# Patient Record
Sex: Female | Born: 1960 | Race: White | Hispanic: No | Marital: Single | Smoking: Never smoker
Health system: Southern US, Community
[De-identification: ages and names within clinical notes are randomized; demographics above are authoritative.]

## PROBLEM LIST (undated history)

## (undated) DIAGNOSIS — D649 Anemia, unspecified: Secondary | ICD-10-CM

## (undated) DIAGNOSIS — Z5189 Encounter for other specified aftercare: Secondary | ICD-10-CM

## (undated) DIAGNOSIS — K589 Irritable bowel syndrome without diarrhea: Secondary | ICD-10-CM

## (undated) DIAGNOSIS — I7782 Antineutrophilic cytoplasmic antibody (ANCA) vasculitis: Secondary | ICD-10-CM

## (undated) DIAGNOSIS — J189 Pneumonia, unspecified organism: Secondary | ICD-10-CM

## (undated) DIAGNOSIS — I776 Arteritis, unspecified: Secondary | ICD-10-CM

## (undated) HISTORY — DX: Irritable bowel syndrome, unspecified: K58.9

## (undated) HISTORY — PX: BREAST EXCISIONAL BIOPSY: SUR124

---

## 1999-11-23 ENCOUNTER — Other Ambulatory Visit: Admission: RE | Admit: 1999-11-23 | Discharge: 1999-11-23 | Payer: Self-pay | Admitting: Family Medicine

## 2000-02-26 ENCOUNTER — Encounter: Admission: RE | Admit: 2000-02-26 | Discharge: 2000-02-26 | Payer: Self-pay | Admitting: *Deleted

## 2000-02-26 ENCOUNTER — Encounter: Payer: Self-pay | Admitting: Family Medicine

## 2000-03-14 ENCOUNTER — Ambulatory Visit (HOSPITAL_COMMUNITY): Admission: RE | Admit: 2000-03-14 | Discharge: 2000-03-14 | Payer: Self-pay | Admitting: Gastroenterology

## 2000-03-14 ENCOUNTER — Encounter (INDEPENDENT_AMBULATORY_CARE_PROVIDER_SITE_OTHER): Payer: Self-pay | Admitting: Specialist

## 2001-06-19 ENCOUNTER — Encounter: Admission: RE | Admit: 2001-06-19 | Discharge: 2001-06-19 | Payer: Self-pay | Admitting: Family Medicine

## 2001-06-19 ENCOUNTER — Encounter: Payer: Self-pay | Admitting: Family Medicine

## 2004-07-21 ENCOUNTER — Encounter: Admission: RE | Admit: 2004-07-21 | Discharge: 2004-07-21 | Payer: Self-pay | Admitting: Family Medicine

## 2004-11-13 ENCOUNTER — Other Ambulatory Visit: Admission: RE | Admit: 2004-11-13 | Discharge: 2004-11-13 | Payer: Self-pay | Admitting: Family Medicine

## 2006-05-06 ENCOUNTER — Other Ambulatory Visit: Admission: RE | Admit: 2006-05-06 | Discharge: 2006-05-06 | Payer: Self-pay | Admitting: Family Medicine

## 2006-07-14 ENCOUNTER — Encounter: Admission: RE | Admit: 2006-07-14 | Discharge: 2006-07-14 | Payer: Self-pay | Admitting: Family Medicine

## 2007-05-17 ENCOUNTER — Other Ambulatory Visit: Admission: RE | Admit: 2007-05-17 | Discharge: 2007-05-17 | Payer: Self-pay | Admitting: Family Medicine

## 2007-12-18 ENCOUNTER — Encounter (HOSPITAL_COMMUNITY): Admission: RE | Admit: 2007-12-18 | Discharge: 2008-03-17 | Payer: Self-pay | Admitting: Gastroenterology

## 2008-07-08 ENCOUNTER — Encounter: Admission: RE | Admit: 2008-07-08 | Discharge: 2008-07-08 | Payer: Self-pay | Admitting: Family Medicine

## 2008-07-19 ENCOUNTER — Encounter: Admission: RE | Admit: 2008-07-19 | Discharge: 2008-07-19 | Payer: Self-pay | Admitting: Family Medicine

## 2008-08-15 ENCOUNTER — Other Ambulatory Visit: Admission: RE | Admit: 2008-08-15 | Discharge: 2008-08-15 | Payer: Self-pay | Admitting: Family Medicine

## 2009-01-31 ENCOUNTER — Ambulatory Visit (HOSPITAL_BASED_OUTPATIENT_CLINIC_OR_DEPARTMENT_OTHER): Admission: RE | Admit: 2009-01-31 | Discharge: 2009-01-31 | Payer: Self-pay | Admitting: Orthopedic Surgery

## 2009-01-31 HISTORY — PX: DORSAL COMPARTMENT RELEASE: SHX1474

## 2009-01-31 HISTORY — PX: GANGLION CYST EXCISION: SHX1691

## 2010-02-06 ENCOUNTER — Encounter: Admission: RE | Admit: 2010-02-06 | Discharge: 2010-02-06 | Payer: Self-pay | Admitting: Family Medicine

## 2010-02-06 ENCOUNTER — Other Ambulatory Visit: Admission: RE | Admit: 2010-02-06 | Discharge: 2010-02-06 | Payer: Self-pay | Admitting: Family Medicine

## 2011-05-04 NOTE — Op Note (Signed)
[name], [name]                ACCOUNT NO.:  [id number]   MEDICAL RECORD NO.:  [id number]          PATIENT TYPE:  AMB   LOCATION:  DSC                          FACILITY:  [facility]   PHYSICIAN:  Katy Fitch. Sypher, M.D. 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   OPERATIONS:  1. Excision of volar capsular ganglion left wrist presenting between      the superficial and dorsal branch of the radial artery.  2. Release of first dorsal compartment, left wrist.   OPERATING SURGEON:  Katy Fitch. Sypher, MD   ASSISTANT:  Marveen Reeks Dasnoit, PA-C   ANESTHESIA:  General by LMA.   SUPERVISING ANESTHESIOLOGIST:  Janetta Hora. 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  Clinical examination revealed marked  swelling of the first dorsal compartment.  Her Finkelstein maneuver is  markedly painful.  She also had a mass presenting between the  superficial and dorsal branches of the radial artery.  This consistent  with a volar ganglion.   She requested correction of both predicaments.   After informed consent, she was brought to the operating room at this  time.  Questions regarding the surgery were invited and answered in  detail preoperatively.   PROCEDURE:  Lima D.  Deakins was brought to the operating room and  placed in supine position on the operating table.   Following the induction of general anesthesia by LMA technique, the left  arm was prepped  with Betadine soap solution and sterilely draped.  A  pneumatic tourniquet was applied to the proximal right brachium.   Following exsanguination of the left arm with an Esmarch bandage, the  arterial tourniquet was inflated to 230 mmHg.  Procedure commenced with  a single transverse incision extending from the first dorsal compartment  across the bifurcation of the radial artery.  Very careful dissection  identified the most palmar branch of the superficial radial sensory  branches.  This gently retracted with a blunt Ragnell retractor.  The  first dorsal compartment was isolated, cleared of all soft tissue, and  split with scalpel and scissors.  There was a conjoined abductor  pollicis longus tendon of large-caliber within the sheath and an  extensor pollicis brevis tendon.  No septum was identified.  Complete  release was ensured.  More palmar dissection revealed through a separate  fascial plane the presenting ganglion.  This was at the bifurcation in  the notch of the dorsal and superficial radial artery branches.   This was circumferentially dissected, decompressed to its contents, and  followed to the wrist capsule.  A small  rongeur was used to remove the  membrane as well as the sinus tract.  The exit point from the volar  wrist ligaments was electrocauterized with bipolar forceps.   The wound was then repaired with intradermal 3-0 Prolene suture.  A  compressive dressing was applied with of a volar plaster splint  maintaining the wrist in 5 degrees dorsiflexion.  For aftercare Ms.  Muha was provided with prescription for Vicodin 5 mg one p.o. q.4-6 h.  p.r.n. pain, 20 tablets without refill.   I will see her back for follow up in office in 1 week or sooner if any  problems.      Katy Fitch Sypher, M.D.  Electronically Signed     RVS/MEDQ  D:  01/31/2009  T:  01/31/2009  Job:  [id number]

## 2011-07-28 ENCOUNTER — Other Ambulatory Visit (HOSPITAL_COMMUNITY)
Admission: RE | Admit: 2011-07-28 | Discharge: 2011-07-28 | Disposition: A | Discharge status: Home / Self Care | Payer: BC Managed Care – PPO | Source: Ambulatory Visit | Attending: Family Medicine | Admitting: Family Medicine

## 2011-07-28 ENCOUNTER — Other Ambulatory Visit: Payer: Self-pay | Admitting: Physician Assistant

## 2011-07-28 DIAGNOSIS — Z124 Encounter for screening for malignant neoplasm of cervix: Secondary | ICD-10-CM | POA: Insufficient documentation

## 2011-07-29 ENCOUNTER — Ambulatory Visit (INDEPENDENT_AMBULATORY_CARE_PROVIDER_SITE_OTHER): Payer: Self-pay | Admitting: General Surgery

## 2011-08-03 ENCOUNTER — Encounter (INDEPENDENT_AMBULATORY_CARE_PROVIDER_SITE_OTHER): Payer: Self-pay | Admitting: General Surgery

## 2011-08-03 ENCOUNTER — Ambulatory Visit (INDEPENDENT_AMBULATORY_CARE_PROVIDER_SITE_OTHER): Payer: BC Managed Care – PPO | Admitting: General Surgery

## 2011-08-03 VITALS — BP 98/68 | HR 66 | Temp 97.8°F | Ht 65.0 in | Wt 110.8 lb

## 2011-08-03 DIAGNOSIS — D171 Benign lipomatous neoplasm of skin and subcutaneous tissue of trunk: Secondary | ICD-10-CM

## 2011-08-03 DIAGNOSIS — D1779 Benign lipomatous neoplasm of other sites: Secondary | ICD-10-CM

## 2011-08-03 NOTE — Progress Notes (Signed)
Yesenia Crawford is a 50 y.o. female.    Chief Complaint  Patient presents with  . Lipoma    Chest Wall Lipoma 3cm    HPI HPI This patient is referred by Norm Parcel for evaluation of a mass on her chest wall which she first noticed a few years ago. She did not concern herself with the mass but she states that it has had some slight increase in size and is concerning to her. She denies any pain from that area she also denies any redness or drainage. She denies any other breast lumps and states that she does do self breast exam. She is due for a mammogram. She denies any first degree relatives with a history of breast cancer.                  Past Medical History  Diagnosis Date  . Ulcerative colitis 1981  . IBS (irritable bowel syndrome)     Past Surgical History  Procedure Date  . Breast cyst excision     History reviewed. No pertinent family history.  Social History History  Substance Use Topics  . Smoking status: Never Smoker   . Smokeless tobacco: Not on file  . Alcohol Use: 1.2 oz/week    2 Glasses of wine per week    Allergies no known allergies  Current Outpatient Prescriptions  Medication Sig Dispense Refill  . azaTHIOprine (IMURAN) 50 MG tablet       . folic acid (FOLVITE) 1 MG tablet       . sulfaSALAzine (AZULFIDINE) 500 MG tablet         Review of Systems Review of Systems  Unable to perform ROS Constitutional: Negative.   HENT: Negative.   Eyes: Negative.   Respiratory: Negative.   Cardiovascular: Negative.   Gastrointestinal: Positive for abdominal pain and diarrhea.  Genitourinary: Negative.   Musculoskeletal: Negative.   Skin: Negative.   Neurological: Negative.   Endo/Heme/Allergies: Negative.   Psychiatric/Behavioral: Negative.     Physical Exam Physical Exam  Constitutional: She is oriented to person, place, and time. She appears well-developed and well-nourished. No distress.  HENT:  Head: Normocephalic and atraumatic.  Eyes:  Conjunctivae and EOM are normal. Pupils are equal, round, and reactive to light. Right eye exhibits no discharge. Left eye exhibits no discharge. No scleral icterus.  Neck: Normal range of motion. Neck supple. No tracheal deviation present.  Cardiovascular: Normal rate, regular rhythm and normal heart sounds.   Respiratory: Effort normal and breath sounds normal. No stridor. No respiratory distress. She has no wheezes.       3 cm x 3 cm, mobile, soft, lesion at the lateral border of the right pectoralis muscle, no skin changes, likely consistent with lipoma  GI: Soft. Bowel sounds are normal. She exhibits no distension and no mass. There is no tenderness. There is no rebound and no guarding.  Musculoskeletal: Normal range of motion. She exhibits no tenderness.  Neurological: She is alert and oriented to person, place, and time.  Skin: Skin is warm and dry. No rash noted. She is not diaphoretic. No erythema. No pallor.     Blood pressure 98/68, pulse 66, temperature 97.8 F (36.6 C), temperature source Temporal, height 5\' 5"  (1.651 m), weight 110 lb 12.8 oz (50.259 kg).  Assessment/Plan Chest wall lipoma  I think that this mass is most likely a soft tissue lipoma and is not very concerning for malignancy. I discussed with the patient the options for continued  observation versus excisional biopsy and she would like to have the lesion removed. I discussed with her the risks of the procedure including infection, bleeding, pain, scarring, poor cosmesis, and injury, and recurrence and she expressed understanding and desires to proceed with excision of right chest mass.  Lodema Pilot DAVID 08/03/2011, 6:29 PM

## 2011-09-20 ENCOUNTER — Other Ambulatory Visit: Payer: Self-pay | Admitting: Gastroenterology

## 2011-11-02 ENCOUNTER — Other Ambulatory Visit: Payer: Self-pay | Admitting: Physician Assistant

## 2011-11-02 DIAGNOSIS — Z1231 Encounter for screening mammogram for malignant neoplasm of breast: Secondary | ICD-10-CM

## 2011-11-03 ENCOUNTER — Telehealth (INDEPENDENT_AMBULATORY_CARE_PROVIDER_SITE_OTHER): Payer: Self-pay | Admitting: General Surgery

## 2011-11-08 ENCOUNTER — Other Ambulatory Visit (INDEPENDENT_AMBULATORY_CARE_PROVIDER_SITE_OTHER): Payer: Self-pay

## 2011-11-09 NOTE — Telephone Encounter (Signed)
Can we schedule surgery or would like prefer to see patient back in office prior to scheduling Breast Lipoma Excision.

## 2011-11-24 ENCOUNTER — Ambulatory Visit: Payer: BC Managed Care – PPO

## 2011-11-25 ENCOUNTER — Ambulatory Visit
Admission: RE | Admit: 2011-11-25 | Discharge: 2011-11-25 | Disposition: A | Discharge status: Home / Self Care | Payer: BC Managed Care – PPO | Source: Ambulatory Visit | Attending: Physician Assistant | Admitting: Physician Assistant

## 2011-11-25 DIAGNOSIS — Z1231 Encounter for screening mammogram for malignant neoplasm of breast: Secondary | ICD-10-CM

## 2012-01-05 ENCOUNTER — Other Ambulatory Visit (INDEPENDENT_AMBULATORY_CARE_PROVIDER_SITE_OTHER): Payer: Self-pay | Admitting: General Surgery

## 2012-02-25 ENCOUNTER — Encounter (HOSPITAL_BASED_OUTPATIENT_CLINIC_OR_DEPARTMENT_OTHER): Payer: Self-pay | Admitting: *Deleted

## 2012-02-29 ENCOUNTER — Encounter (HOSPITAL_BASED_OUTPATIENT_CLINIC_OR_DEPARTMENT_OTHER): Payer: Self-pay | Admitting: *Deleted

## 2012-02-29 NOTE — Pre-Procedure Instructions (Signed)
PT STATES SHE HAS HAD NON BLOODY DIARRHEA X3 WKS WITH WT LOSS 12 LBS. NOW WITH CONG AND DRY COUGH LOW GRADE FEVERS X4D. SHE WILL CALL us IF FEVER GOES UP AND SYMPTOMS WORSEN

## 2012-03-01 ENCOUNTER — Ambulatory Visit (INDEPENDENT_AMBULATORY_CARE_PROVIDER_SITE_OTHER): Payer: BC Managed Care – PPO | Admitting: Family Medicine

## 2012-03-01 ENCOUNTER — Ambulatory Visit: Payer: BC Managed Care – PPO

## 2012-03-01 VITALS — BP 103/68 | HR 103 | Temp 99.7°F | Resp 18 | Ht 65.0 in | Wt 103.0 lb

## 2012-03-01 DIAGNOSIS — R197 Diarrhea, unspecified: Secondary | ICD-10-CM

## 2012-03-01 DIAGNOSIS — R509 Fever, unspecified: Secondary | ICD-10-CM

## 2012-03-01 DIAGNOSIS — R1084 Generalized abdominal pain: Secondary | ICD-10-CM

## 2012-03-01 DIAGNOSIS — R0989 Other specified symptoms and signs involving the circulatory and respiratory systems: Secondary | ICD-10-CM

## 2012-03-01 DIAGNOSIS — H532 Diplopia: Secondary | ICD-10-CM

## 2012-03-01 LAB — POCT CBC
Granulocyte percent: 76.7 % (ref 37–80)
HCT, POC: 29.3 % — AB (ref 37.7–47.9)
Hemoglobin: 9.1 g/dL — AB (ref 12.2–16.2)
Lymph, poc: 1.2 (ref 0.6–3.4)
MCH, POC: 29.4 pg (ref 27–31.2)
MCHC: 31.1 g/dL — AB (ref 31.8–35.4)
MCV: 94.4 fL (ref 80–97)
MID (cbc): 0.4 (ref 0–0.9)
MPV: 8.2 fL (ref 0–99.8)
POC Granulocyte: 5.3 (ref 2–6.9)
POC LYMPH PERCENT: 17.8 %L (ref 10–50)
POC MID %: 5.5 %M (ref 0–12)
Platelet Count, POC: 393 10*3/uL (ref 142–424)
RBC: 3.1 M/uL — AB (ref 4.04–5.48)
RDW, POC: 13.8 %
WBC: 6.9 10*3/uL (ref 4.6–10.2)

## 2012-03-01 MED ORDER — AZITHROMYCIN 250 MG PO TABS: ORAL_TABLET | ORAL | Status: AC

## 2012-03-01 NOTE — Progress Notes (Signed)
[facility]:  Office Visit  Chief Complaint:  Chief Complaint  Patient presents with  . Diarrhea    * 3 weeks (has seen 2 different Dr's for this)  . Fever    low grade   . Chest Pain    when takes a deep breath- c/o of congestion also    HPI: Yesenia Crawford is a 51 y.o. female who complains of  A 3 week h/o nonbloody diarrhea which she was given cipro and flagyl for possible GI issues sicne she has ulcerative colitis, her stool cx came back for negative growth; she finished the medication but continues to ohave diarrhea. She denies this is similar to her UC. She has associated low grade temp and also chest congestion during this whole time and feels fatigued. She does not have an appetite however is able to eat. Diarrhea has no odor, no history of C. Diff  Dr. Laural Benes is her GI specialist.   Past Medical History  Diagnosis Date  . Ulcerative colitis 1981    HAS CURRENT NONBLOODY DIARRHEA X3 WKS  . IBS (irritable bowel syndrome)   . Ulcerative colitis    Past Surgical History  Procedure Date  . Breast cyst excision   . Ganglion cyst excision 01/31/2009    left wrist  . Dorsal compartment release 01/31/2009    release 1st dorsal compartment   History   Social History  . Marital Status: Single    Spouse Name: N/A    Number of Children: N/A  . Years of Education: N/A   Social History Main Topics  . Smoking status: Never Smoker   . Smokeless tobacco: None  . Alcohol Use: 1.2 oz/week    2 Glasses of wine per week     SOCIAL  . Drug Use: No  . Sexually Active: None   Other Topics Concern  . None   Social History Narrative  . None   Family History  Problem Relation Age of Onset  . Cancer Mother    Allergies  Allergen Reactions  . Metronidazole Hives   Prior to Admission medications   Medication Sig Start Date End Date Taking? Authorizing Provider  azaTHIOprine (IMURAN) 50 MG tablet 100 mg daily.  06/21/11  Yes Historical Provider, MD    folic acid (FOLVITE) 1 MG tablet 1 mg daily.  05/21/11  Yes Historical Provider, MD  sulfaSALAzine (AZULFIDINE) 500 MG tablet 500 mg 4 (four) times daily.  07/09/11  Yes Historical Provider, MD  Multiple Vitamin (MULITIVITAMIN WITH MINERALS) TABS Take 1 tablet by mouth daily.    Historical Provider, MD     ROS: The patient denies chills, night sweats, unintentional weight loss, chest pain, palpitations, wheezing, dyspnea on exertion, nausea, vomiting, abdominal pain, dysuria, hematuria, melena, numbness, weakness, or tingling.+ diarrhea. Chest congestion, fevers All other systems have been reviewed and were otherwise negative with the exception of those mentioned in the HPI and as above.    PHYSICAL EXAM: Filed Vitals:   03/01/12 1929  BP: 103/68  Pulse: 103  Temp: 99.7 F (37.6 C)  Resp: 18   Filed Vitals:   03/01/12 1929  Height: 5\' 5"  (1.651 m)  Weight: 103 lb (46.72 kg)   Body mass index is 17.14 kg/(m^2).  General: Alert, no acute distress, tired appearing HEENT:  Normocephalic, atraumatic, oropharynx patent. TM nl, no sinus tenderness, no exudates. EOMI, PERRLA, mucosa dry Cardiovascular:  Regular rate and rhythm, no rubs murmurs or gallops.  No Carotid bruits,  radial pulse intact. No pedal edema.  Respiratory: Clear to auscultation bilaterally.  No wheezes, rales, or rhonchi.  No cyanosis, no use of accessory musculature GI: No organomegaly, abdomen is soft and non-tender, positive bowel sounds.  No masses. Skin: No rashes. Neurologic: Facial musculature symmetric. Psychiatric: Patient is appropriate throughout our interaction. Lymphatic: No cervical lymphadenopathy Musculoskeletal: Gait intact.   LABS: Results for orders placed in visit on 03/01/12  POCT CBC      Component Value Range   WBC 6.9  4.6 - 10.2 (K/uL)   Lymph, poc 1.2  0.6 - 3.4    POC LYMPH PERCENT 17.8  10 - 50 (%L)   MID (cbc) 0.4  0 - 0.9    POC MID % 5.5  0 - 12 (%M)   POC Granulocyte 5.3  2 - 6.9     Granulocyte percent 76.7  37 - 80 (%G)   RBC 3.10 (*) 4.04 - 5.48 (M/uL)   Hemoglobin 9.1 (*) 12.2 - 16.2 (g/dL)   HCT, POC 96.2 (*) 95.2 - 47.9 (%)   MCV 94.4  80 - 97 (fL)   MCH, POC 29.4  27 - 31.2 (pg)   MCHC 31.1 (*) 31.8 - 35.4 (g/dL)   RDW, POC 84.1     Platelet Count, POC 393  142 - 424 (K/uL)   MPV 8.2  0 - 99.8 (fL)     EKG/XRAY:   Primary read interpreted by Dr. Conley Rolls at [facility]. ? Bronchitic changes on CXR, abdomen is normal       ASSESSMENT/PLAN: Encounter Diagnoses  Name Primary?  . Chest congestion Yes  . Fever   . Diarrhea    Anemia    ? Etiology viral GI bug ( stool cx negative) vs ? colitits ( C. Diff vs UC). Currenty no signs of infection/inflammation on CBC. Anemia-asked patient to continue folate and take iron, she needs to f/u with GI for anemia workup. She has always been anemic based on out records but not this low. Needs f/u in 1-2 weeks. If sxs worsen then need to see GI sooner.  Chest congestion-will rx Z-pack for ? Bronchitis.     Hamilton Capri PHUONG, DO 03/01/2012 8:34 PM

## 2012-03-02 ENCOUNTER — Ambulatory Visit (HOSPITAL_BASED_OUTPATIENT_CLINIC_OR_DEPARTMENT_OTHER)
Admission: RE | Admit: 2012-03-02 | Discharge: 2012-03-02 | Disposition: A | Discharge status: Home / Self Care | Payer: BC Managed Care – PPO | Source: Ambulatory Visit | Attending: General Surgery | Admitting: General Surgery

## 2012-03-02 ENCOUNTER — Encounter (HOSPITAL_BASED_OUTPATIENT_CLINIC_OR_DEPARTMENT_OTHER): Payer: Self-pay | Admitting: Anesthesiology

## 2012-03-02 ENCOUNTER — Ambulatory Visit (HOSPITAL_BASED_OUTPATIENT_CLINIC_OR_DEPARTMENT_OTHER): Payer: BC Managed Care – PPO | Admitting: Anesthesiology

## 2012-03-02 ENCOUNTER — Encounter (HOSPITAL_BASED_OUTPATIENT_CLINIC_OR_DEPARTMENT_OTHER)
Admission: RE | Disposition: A | Discharge status: Home / Self Care | Payer: Self-pay | Source: Ambulatory Visit | Attending: General Surgery

## 2012-03-02 ENCOUNTER — Encounter (HOSPITAL_BASED_OUTPATIENT_CLINIC_OR_DEPARTMENT_OTHER): Payer: Self-pay | Admitting: *Deleted

## 2012-03-02 DIAGNOSIS — D1739 Benign lipomatous neoplasm of skin and subcutaneous tissue of other sites: Secondary | ICD-10-CM

## 2012-03-02 HISTORY — PX: MASS EXCISION: SHX2000

## 2012-03-02 SURGERY — EXCISION MASS
Anesthesia: Monitor Anesthesia Care | Site: Chest | Laterality: Right | Wound class: Clean

## 2012-03-02 MED ORDER — ONDANSETRON HCL 4 MG/2ML IJ SOLN
INTRAMUSCULAR | Status: DC | PRN
  Administered 2012-03-02: 4 mg via INTRAVENOUS

## 2012-03-02 MED ORDER — PROPOFOL 10 MG/ML IV EMUL
INTRAVENOUS | Status: DC | PRN
  Administered 2012-03-02: 50 ug/kg/min via INTRAVENOUS

## 2012-03-02 MED ORDER — HYDROCODONE-ACETAMINOPHEN 5-325 MG PO TABS: 1.0000 | ORAL_TABLET | Freq: Four times a day (QID) | ORAL | Status: AC | PRN

## 2012-03-02 MED ORDER — LACTATED RINGERS IV SOLN
INTRAVENOUS | Status: DC
  Administered 2012-03-02: 10:00:00 via INTRAVENOUS

## 2012-03-02 MED ORDER — CEFAZOLIN SODIUM 1-5 GM-% IV SOLN
1.0000 g | INTRAVENOUS | Status: AC
  Administered 2012-03-02: 1 g via INTRAVENOUS

## 2012-03-02 MED ORDER — LIDOCAINE-EPINEPHRINE (PF) 1 %-1:200000 IJ SOLN
INTRAMUSCULAR | Status: DC | PRN
  Administered 2012-03-02: 10:00:00 via INTRAMUSCULAR

## 2012-03-02 MED ORDER — FENTANYL CITRATE 0.05 MG/ML IJ SOLN
INTRAMUSCULAR | Status: DC | PRN
  Administered 2012-03-02 (×2): 50 ug via INTRAVENOUS

## 2012-03-02 MED ORDER — PROPOFOL 10 MG/ML IV EMUL
INTRAVENOUS | Status: DC | PRN
  Administered 2012-03-02: 40 mg via INTRAVENOUS

## 2012-03-02 MED ORDER — MIDAZOLAM HCL 5 MG/5ML IJ SOLN
INTRAMUSCULAR | Status: DC | PRN
  Administered 2012-03-02 (×2): 1 mg via INTRAVENOUS

## 2012-03-02 SURGICAL SUPPLY — 37 items
ADH SKN CLS APL DERMABOND .7 (GAUZE/BANDAGES/DRESSINGS) ×1
BLADE SURG 15 STRL LF DISP TIS (BLADE) ×1 IMPLANT
BLADE SURG 15 STRL SS (BLADE) ×2
CANISTER SUCTION 1200CC (MISCELLANEOUS) IMPLANT
CHLORAPREP W/TINT 26ML (MISCELLANEOUS) ×2 IMPLANT
COVER MAYO STAND STRL (DRAPES) ×2 IMPLANT
COVER TABLE BACK 60X90 (DRAPES) ×2 IMPLANT
DECANTER SPIKE VIAL GLASS SM (MISCELLANEOUS) IMPLANT
DERMABOND ADVANCED (GAUZE/BANDAGES/DRESSINGS) ×1
DERMABOND ADVANCED .7 DNX12 (GAUZE/BANDAGES/DRESSINGS) IMPLANT
DRAPE PED LAPAROTOMY (DRAPES) ×2 IMPLANT
DRAPE UTILITY XL STRL (DRAPES) ×2 IMPLANT
ELECT COATED BLADE 2.86 ST (ELECTRODE) ×2 IMPLANT
ELECT REM PT RETURN 9FT ADLT (ELECTROSURGICAL) ×2
ELECTRODE REM PT RTRN 9FT ADLT (ELECTROSURGICAL) ×1 IMPLANT
GAUZE SPONGE 4X4 12PLY STRL LF (GAUZE/BANDAGES/DRESSINGS) ×2 IMPLANT
GLOVE BIOGEL M 7.0 STRL (GLOVE) ×1 IMPLANT
GLOVE BIOGEL PI IND STRL 7.5 (GLOVE) IMPLANT
GLOVE BIOGEL PI INDICATOR 7.5 (GLOVE) ×1
GLOVE SURG SS PI 7.5 STRL IVOR (GLOVE) ×4 IMPLANT
GOWN PREVENTION PLUS XLARGE (GOWN DISPOSABLE) IMPLANT
GOWN PREVENTION PLUS XXLARGE (GOWN DISPOSABLE) ×3 IMPLANT
NEEDLE HYPO 22GX1.5 SAFETY (NEEDLE) ×2 IMPLANT
NS IRRIG 1000ML POUR BTL (IV SOLUTION) IMPLANT
PACK BASIN DAY SURGERY FS (CUSTOM PROCEDURE TRAY) ×2 IMPLANT
PENCIL BUTTON HOLSTER BLD 10FT (ELECTRODE) ×2 IMPLANT
SLEEVE SCD COMPRESS KNEE MED (MISCELLANEOUS) IMPLANT
SPONGE LAP 4X18 X RAY DECT (DISPOSABLE) ×1 IMPLANT
SUT MNCRL AB 4-0 PS2 18 (SUTURE) ×2 IMPLANT
SUT SILK 2 0 SH (SUTURE) ×2 IMPLANT
SUT VICRYL 3-0 CR8 SH (SUTURE) ×2 IMPLANT
SYR CONTROL 10ML LL (SYRINGE) ×2 IMPLANT
TOWEL OR 17X24 6PK STRL BLUE (TOWEL DISPOSABLE) ×2 IMPLANT
TOWEL OR NON WOVEN STRL DISP B (DISPOSABLE) ×2 IMPLANT
TUBE CONNECTING 20X1/4 (TUBING) IMPLANT
WATER STERILE IRR 1000ML POUR (IV SOLUTION) ×1 IMPLANT
YANKAUER SUCT BULB TIP NO VENT (SUCTIONS) IMPLANT

## 2012-03-02 NOTE — Discharge Instructions (Signed)

## 2012-03-02 NOTE — Transfer of Care (Signed)
Immediate Anesthesia Transfer of Care Note  Patient: Yesenia Crawford  Procedure(s) Performed: Procedure(s) (LRB): EXCISION MASS (Right)  Patient Location: PACU  Anesthesia Type: MAC  Level of Consciousness: awake and alert   Airway & Oxygen Therapy: Patient Spontanous Breathing  Post-op Assessment: Report given to PACU RN and Post -op Vital signs reviewed and stable  Post vital signs: Reviewed and stable  Complications: No apparent anesthesia complications

## 2012-03-02 NOTE — Anesthesia Preprocedure Evaluation (Signed)
Anesthesia Evaluation  Patient identified by MRN, date of birth, ID band Patient awake    Reviewed: Allergy & Precautions, H&P , NPO status , Patient's Chart, lab work & pertinent test results  History of Anesthesia Complications Negative for: history of anesthetic complications  Airway Mallampati: I TM Distance: >3 FB Neck ROM: Full    Dental No notable dental hx. (+) Teeth Intact and Dental Advisory Given   Pulmonary Recent URI , Resolved,  breath sounds clear to auscultation  Pulmonary exam normal       Cardiovascular - Past MI Rhythm:Regular Rate:Normal     Neuro/Psych negative neurological ROS  negative psych ROS   GI/Hepatic Neg liver ROS, Ulcerative colitis/Irritable bowel-diarrhea   Endo/Other  negative endocrine ROS  Renal/GU negative Renal ROS     Musculoskeletal   Abdominal   Peds  Hematology Anemic Hb 9.1   Anesthesia Other Findings   Reproductive/Obstetrics Post menopausal                           Anesthesia Physical Anesthesia Plan  ASA: II  Anesthesia Plan: MAC   Post-op Pain Management:    Induction: Intravenous  Airway Management Planned: Simple Face Mask  Additional Equipment:   Intra-op Plan:   Post-operative Plan:   Informed Consent: I have reviewed the patients History and Physical, chart, labs and discussed the procedure including the risks, benefits and alternatives for the proposed anesthesia with the patient or authorized representative who has indicated his/her understanding and acceptance.   Dental advisory given  Plan Discussed with: CRNA and Surgeon  Anesthesia Plan Comments: (Plan routine monitors, MAC)        Anesthesia Quick Evaluation

## 2012-03-02 NOTE — Op Note (Signed)
NAMEJERMEKA, Yesenia Crawford                ACCOUNT NO.:  [id number]  MEDICAL RECORD NO.:  [id number]  LOCATION:                                 FACILITY:  PHYSICIAN:  Lodema Pilot, MD       DATE OF BIRTH:  01/25/1961  DATE OF PROCEDURE:  03/02/2012 DATE OF DISCHARGE:                              OPERATIVE REPORT   PROCEDURE:  Excision of right chest wall mass.  PREOPERATIVE DIAGNOSIS:  Lipoma.  POSTOPERATIVE DIAGNOSIS:  Lipoma.  SURGEON:  Lodema Pilot, MD  ASSISTANT:  None.  ANESTHESIA:  Moderate sedation with 20 mL of 1% lidocaine with epinephrine and 0.25% Marcaine in a 50:50 mixture.  FLUIDS:  600 mL of crystalloid.  ESTIMATED BLOOD LOSS:  Minimal.  DRAINS:  None.  SPECIMENS:  Right chest wall mass measuring 4 cm x 2 cm, sent to Pathology for permanent sectioning.  COMPLICATIONS:  None apparent.  FINDINGS:  A 4 cm x 2 cm lipomatous-appearing mass with a 2 layer closure.  INDICATION FOR PROCEDURE:  [name] is a 51 year old female with a 3- cm chest wall mass consistent with lipoma.  On exam, she had a mammogram, which was negative, and she desired excision.  OPERATIVE DETAILS:  [name] was seen and evaluated in preoperative area.  Risks and benefits of the procedure were again discussed in lay terms.  Informed consent was obtained.  Surgical site was marked prior to anesthetic administration.  She was taken to the operating room, placed on table in supine position.  Moderate sedation was obtained, and prophylactic antibiotics were given.  The area was prepped and draped in a standard surgical fashion, and a transverse incision was made over the palpable mass and dissection carried down to subcutaneous tissue using Bovie electrocautery.  The lipomatous-appearing fat was identified and then dissected bluntly circumferentially with a hemostat, and the lesion was delivered through the wound.  The fibrous attachments were divided with Bovie electrocautery, and the  lesion was completely removed and measured, which measured 4 cm x 2 cm.  The specimen was sent to Pathology for permanent sectioning.  The wound was then palpated for any other residual lipomatous masses and none was appreciated.  The wound was injected with 20 mL of 1% lidocaine with epinephrine and 0.25% Marcaine in a 50:50 mixture, and the wound was irrigated with sterile saline solution.  Hemostasis was obtained with Bovie electrocautery, and then the dermis was approximated with interrupted 3-0 Vicryl sutures, and the skin edges were approximated with 4-0 Monocryl subcuticular suture.  Skin was washed and dried.  Dermabond was applied.  All sponge, needle, and instrument counts were correct at the end of the case.  The patient tolerated the procedure well without apparent complication.          ______________________________ Lodema Pilot, MD     BL/MEDQ  D:  03/02/2012  T:  03/02/2012  Job:  [id number]

## 2012-03-02 NOTE — Anesthesia Postprocedure Evaluation (Signed)
  Anesthesia Post-op Note  Patient: Yesenia Crawford  Procedure(s) Performed: Procedure(s) (LRB): EXCISION MASS (Right)  Patient Location: PACU  Anesthesia Type: MAC  Level of Consciousness: awake, alert  and oriented  Airway and Oxygen Therapy: Patient Spontanous Breathing  Post-op Pain: none  Post-op Assessment: Post-op Vital signs reviewed, Patient's Cardiovascular Status Stable, Respiratory Function Stable, Patent Airway, No signs of Nausea or vomiting and Pain level controlled  Post-op Vital Signs: Reviewed and stable  Complications: No apparent anesthesia complications

## 2012-03-02 NOTE — H&P (Signed)
This patient is referred by Norm Parcel for evaluation of a mass on her chest wall which she first noticed a few years ago. She did not concern herself with the mass but she states that it has had some slight increase in size and is concerning to her. She denies any pain from that area she also denies any redness or drainage. She denies any other breast lumps and states that she does do self breast exam. She is due for a mammogram. She denies any first degree relatives with a history of breast cancer.  She had normal mammogram.  She denies any other changes except feeling sick over the last 3 weeks with a "virus" and diarrhea.  She is currently followed by her GI doctor  Past Medical History   Diagnosis  Date   .  Ulcerative colitis  1981   .  IBS (irritable bowel syndrome)     Past Surgical History   Procedure  Date   .  Breast cyst excision     History reviewed. No pertinent family history.  Social History  History   Substance Use Topics   .  Smoking status:  Never Smoker   .  Smokeless tobacco:  Not on file   .  Alcohol Use:  1.2 oz/week     2 Glasses of wine per week    Allergies no known allergies  Current Outpatient Prescriptions   Medication  Sig  Dispense  Refill   .  azaTHIOprine (IMURAN) 50 MG tablet      .  folic acid (FOLVITE) 1 MG tablet      .  sulfaSALAzine (AZULFIDINE) 500 MG tablet       Review of Systems  Review of Systems  Unable to perform ROS  Constitutional: Negative.  HENT: Negative.  Eyes: Negative.  Respiratory: Negative.  Cardiovascular: Negative.  Gastrointestinal: Positive for abdominal pain and diarrhea.  Genitourinary: Negative.  Musculoskeletal: Negative.  Skin: Negative.  Neurological: Negative.  Endo/Heme/Allergies: Negative.  Psychiatric/Behavioral: Negative.   Physical Exam  Physical Exam  Constitutional: She is oriented to person, place, and time. She appears well-developed and well-nourished. No distress.  HENT:  Head: Normocephalic  and atraumatic.  Eyes: Conjunctivae and EOM are normal. Pupils are equal, round, and reactive to light. Right eye exhibits no discharge. Left eye exhibits no discharge. No scleral icterus.  Neck: Normal range of motion. Neck supple. No tracheal deviation present.  Cardiovascular: Normal rate, regular rhythm and normal heart sounds.  Respiratory: Effort normal and breath sounds normal. No stridor. No respiratory distress. She has no wheezes.  3 cm x 3 cm, mobile, soft, lesion at the lateral border of the right pectoralis muscle, no skin changes, likely consistent with lipoma  GI: Soft. Bowel sounds are normal. She exhibits no distension and no mass. There is no tenderness. There is no rebound and no guarding.  Musculoskeletal: Normal range of motion. She exhibits no tenderness.  Neurological: She is alert and oriented to person, place, and time.  Skin: Skin is warm and dry. No rash noted. She is not diaphoretic. No erythema. No pallor.   Assessment/Plan  Chest wall lipoma  I think that this mass is most likely a soft tissue lipoma and is not very concerning for malignancy. I discussed with the patient the options for continued observation versus excisional biopsy and she would like to have the lesion removed. I discussed with her the risks of the procedure including infection, bleeding, pain, scarring, poor cosmesis, and injury,  and recurrence and she expressed understanding and desires to proceed with excision of right chest mass. The surgical site was marked with the patient. she denies any changes from the above history except for the recent "virus".

## 2012-03-02 NOTE — Brief Op Note (Signed)
03/02/2012  10:42 AM  PATIENT:  Jac Canavan  51 y.o. female  PRE-OPERATIVE DIAGNOSIS:  right chest wall mass 3x3   POST-OPERATIVE DIAGNOSIS:  right chest wall mass 3x3   PROCEDURE:  Procedure(s) (LRB): EXCISION MASS (Right)  SURGEON:  Surgeon(s) and Role:    * Lodema Pilot, DO - Primary  PHYSICIAN ASSISTANT:   ASSISTANTS: none   ANESTHESIA:   IV sedation  EBL:  Total I/O In: 100 [I.V.:100] Out: -   BLOOD ADMINISTERED:none  DRAINS: none   LOCAL MEDICATIONS USED:  MARCAINE    and LIDOCAINE   SPECIMEN:  Source of Specimen:  right chest wall mass, 4cm x 2cm  DISPOSITION OF SPECIMEN:  PATHOLOGY  COUNTS:  YES  TOURNIQUET:  * No tourniquets in log *  DICTATION: .Other Dictation: Dictation Number [id number]  PLAN OF CARE: Discharge to home after PACU  PATIENT DISPOSITION:  PACU - hemodynamically stable.   Delay start of Pharmacological VTE agent (>24hrs) due to surgical blood loss or risk of bleeding: no

## 2012-03-03 ENCOUNTER — Encounter (HOSPITAL_BASED_OUTPATIENT_CLINIC_OR_DEPARTMENT_OTHER): Payer: Self-pay | Admitting: General Surgery

## 2012-03-16 ENCOUNTER — Other Ambulatory Visit: Payer: Self-pay | Admitting: Family Medicine

## 2012-03-16 DIAGNOSIS — D649 Anemia, unspecified: Secondary | ICD-10-CM

## 2012-03-16 DIAGNOSIS — R197 Diarrhea, unspecified: Secondary | ICD-10-CM

## 2012-03-17 ENCOUNTER — Ambulatory Visit
Admission: RE | Admit: 2012-03-17 | Discharge: 2012-03-17 | Disposition: A | Discharge status: Home / Self Care | Payer: BC Managed Care – PPO | Source: Ambulatory Visit | Attending: Family Medicine | Admitting: Family Medicine

## 2012-03-17 DIAGNOSIS — R197 Diarrhea, unspecified: Secondary | ICD-10-CM

## 2012-03-17 DIAGNOSIS — D649 Anemia, unspecified: Secondary | ICD-10-CM

## 2012-03-17 MED ORDER — IOHEXOL 300 MG/ML  SOLN
100.0000 mL | Freq: Once | INTRAMUSCULAR | Status: AC | PRN
  Administered 2012-03-17: 100 mL via INTRAVENOUS

## 2012-03-22 ENCOUNTER — Telehealth (INDEPENDENT_AMBULATORY_CARE_PROVIDER_SITE_OTHER): Payer: Self-pay | Admitting: General Surgery

## 2012-03-22 NOTE — Telephone Encounter (Signed)
Patient given post appointment date & time for 03/30/12 @ 8:15 am w/Dr. Biagio Quint

## 2012-03-22 NOTE — Telephone Encounter (Signed)
Appt made for 03/30/12 @ 8:15

## 2012-03-27 ENCOUNTER — Other Ambulatory Visit: Payer: Self-pay | Admitting: Internal Medicine

## 2012-03-27 ENCOUNTER — Telehealth: Payer: Self-pay | Admitting: Internal Medicine

## 2012-03-27 ENCOUNTER — Telehealth: Payer: Self-pay | Admitting: *Deleted

## 2012-03-27 DIAGNOSIS — D649 Anemia, unspecified: Secondary | ICD-10-CM

## 2012-03-27 DIAGNOSIS — D539 Nutritional anemia, unspecified: Secondary | ICD-10-CM

## 2012-03-27 NOTE — Telephone Encounter (Signed)
l/m at home and cell to call for appt  aom

## 2012-03-27 NOTE — Telephone Encounter (Signed)
Rec'd referral for patient requesting to see Dr Arbutus Ped for recent onset of anemia, uncertain etiology from Dr Lupe Carney. Received further information from patient that she is also seeing Dr Danise Edge, GI for possible causes. Called and left message with Dr Henriette Combs officer, per patient request to acquire records from last week. Patient to be scheduled soon. Orders to scheduling.

## 2012-03-27 NOTE — Telephone Encounter (Signed)
Referred by Dr. Clovis Riley Dx- Severe Anemia

## 2012-03-28 ENCOUNTER — Telehealth: Payer: Self-pay | Admitting: Internal Medicine

## 2012-03-28 ENCOUNTER — Encounter (HOSPITAL_COMMUNITY)
Admission: RE | Admit: 2012-03-28 | Discharge: 2012-03-28 | Disposition: A | Discharge status: Home / Self Care | Payer: BC Managed Care – PPO | Source: Ambulatory Visit | Attending: Internal Medicine | Admitting: Internal Medicine

## 2012-03-28 ENCOUNTER — Ambulatory Visit: Payer: BC Managed Care – PPO

## 2012-03-28 ENCOUNTER — Ambulatory Visit: Payer: BC Managed Care – PPO | Admitting: Lab

## 2012-03-28 ENCOUNTER — Ambulatory Visit (HOSPITAL_BASED_OUTPATIENT_CLINIC_OR_DEPARTMENT_OTHER): Payer: BC Managed Care – PPO | Admitting: Internal Medicine

## 2012-03-28 ENCOUNTER — Other Ambulatory Visit (HOSPITAL_BASED_OUTPATIENT_CLINIC_OR_DEPARTMENT_OTHER): Payer: BC Managed Care – PPO | Admitting: Lab

## 2012-03-28 VITALS — BP 129/77 | HR 71 | Temp 97.8°F | Ht 65.0 in | Wt 98.6 lb

## 2012-03-28 DIAGNOSIS — D649 Anemia, unspecified: Secondary | ICD-10-CM

## 2012-03-28 DIAGNOSIS — D539 Nutritional anemia, unspecified: Secondary | ICD-10-CM

## 2012-03-28 LAB — CBC & DIFF AND RETIC
BASO%: 0 % (ref 0.0–2.0)
Immature Retic Fract: 9.5 % (ref 1.60–10.00)
MCHC: 31.4 g/dL — ABNORMAL LOW (ref 31.5–36.0)
MONO#: 0.2 10*3/uL (ref 0.1–0.9)
NEUT#: 2.8 10*3/uL (ref 1.5–6.5)
RBC: 2.42 10*6/uL — ABNORMAL LOW (ref 3.70–5.45)
Retic %: 3.34 % — ABNORMAL HIGH (ref 0.70–2.10)
Retic Ct Abs: 80.83 10*3/uL (ref 33.70–90.70)
WBC: 3.5 10*3/uL — ABNORMAL LOW (ref 3.9–10.3)
lymph#: 0.5 10*3/uL — ABNORMAL LOW (ref 0.9–3.3)

## 2012-03-28 LAB — ABO/RH: ABO/RH(D): O NEG

## 2012-03-28 NOTE — Telephone Encounter (Signed)
gve the pt her April 2013 appt calendar 

## 2012-03-28 NOTE — Progress Notes (Signed)
[facility] Telephone:([phone]   Fax:(336) [phone]  CONSULT NOTE  REASON FOR CONSULTATION:  51 years old white female with severe anemia of unknown etiology.  HPI Yesenia Crawford is a 51 y.o. female was past medical history significant for ulcerative colitis diagnosed almost 30 years ago. The patient has been on treatment with sulfasalazine for long-time she was also started on treatment with azathioprine 3 years ago and was tolerating it fairly well. In February of 2013, the patient started complaining of generalized weakness and persistent diarrhea. She was seen by her primary care physician and was treated with antibiotics in addition to Flagyl for questionable infectious etiology of the diarrhea. The patient has no improvement. She was referred to her gastroenterologist Dr. Laural Benes and again treated for upper respiratory infection as well as gastroenteritis. The patient was also noted to have broken her hemoglobin from 12.5 g/dl to 4.4W/NU. Her azathioprine was discontinued. The patient was started on ferrous fumarate 325 mg by mouth daily. ESR was over 140. She had normal surveillance colonoscopy in October of 2012. She also had CT scan of the abdomen and pelvis on 03/17/2012 that was unremarkable for any significant abnormalities except for small amount of pleural fluid and pericardial fluid, but no masses or adenopathy. Chest x-ray on 03/03/2012 showed no active lung disease. The patient was referred to me today for further evaluation and recommendation regarding her condition. She denied having any significant complaints today except for the generalized fatigue as well as shortness breath with exertion and sore throat. She has no significant weight loss but has night sweats.   @SFHPI @  Past Medical History  Diagnosis Date  . Ulcerative colitis 1981    HAS CURRENT NONBLOODY DIARRHEA X3 WKS  . IBS (irritable bowel syndrome)   . Ulcerative colitis     Past Surgical  History  Procedure Date  . Breast cyst excision   . Ganglion cyst excision 01/31/2009    left wrist  . Dorsal compartment release 01/31/2009    release 1st dorsal compartment  . Mass excision 03/02/2012    Procedure: EXCISION MASS;  Surgeon: Lodema Pilot, DO;  Location: [facility];  Service: General;  Laterality: Right;  excison of right chest wall mass 3x3     Family History  Problem Relation Age of Onset  . Cancer Mother     Social History History  Substance Use Topics  . Smoking status: Never Smoker   . Smokeless tobacco: Not on file  . Alcohol Use: 1.2 oz/week    2 Glasses of wine per week     SOCIAL    Allergies  Allergen Reactions  . Metronidazole Hives    Current Outpatient Prescriptions  Medication Sig Dispense Refill  . folic acid (FOLVITE) 1 MG tablet 1 mg daily.       . Multiple Vitamin (MULITIVITAMIN WITH MINERALS) TABS Take 1 tablet by mouth daily.      Marland Kitchen sulfaSALAzine (AZULFIDINE) 500 MG tablet 500 mg 4 (four) times daily.       Marland Kitchen azaTHIOprine (IMURAN) 50 MG tablet 100 mg daily.         Review of Systems  A comprehensive review of systems was negative except for: Constitutional: positive for anorexia, fatigue and night sweats  Physical Exam  UVO:ZDGUY, healthy, no distress, well nourished and well developed SKIN: skin color, texture, turgor are normal, pale. HEAD: Normocephalic, No masses, lesions, tenderness or abnormalities EYES: normal EARS: External ears normal OROPHARYNX:no exudate  and no erythema  NECK: supple, no adenopathy LYMPH:  no palpable lymphadenopathy, no hepatosplenomegaly BREAST:not examined LUNGS: clear to auscultation  HEART: regular rate & rhythm, no murmurs and no gallops ABDOMEN:abdomen soft, non-tender, normal bowel sounds and no masses or organomegaly BACK: Back symmetric, no curvature. EXTREMITIES:no joint deformities, effusion, or inflammation, no edema, no skin discoloration, no clubbing, no cyanosis    NEURO: alert & oriented x 3 with fluent speech, no focal motor/sensory deficits  PERFORMANCE STATUS: ECOG   Studies/Results: Ct Abdomen Pelvis W Contrast  03/17/2012  *RADIOLOGY REPORT*  Clinical Data: Left-sided chest pain.  Decreased hemoglobin. Diarrhea.  Nausea and vomiting.  Low grade fever.  Recent greater than 10 x 1 loss.  CT ABDOMEN AND PELVIS WITH CONTRAST  Technique:  Multidetector CT imaging of the abdomen and pelvis was performed following the standard protocol during bolus administration of intravenous contrast.  Contrast: OMNIPAQUE IOHEXOL 300 MG/ML IJ SOLN  Comparison: None.  Findings: Images through the lung bases show a small left-sided pleural effusion and mild left basilar atelectasis.  There is also a tiny pericardial effusion versus pericardial thickening.  The abdominal parenchymal organs are normal in appearance. Gallbladder is unremarkable.  No evidence of hydronephrosis.  No soft tissue masses or lymphadenopathy are identified within the abdomen or pelvis.  Uterus and adnexa are unremarkable.  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  The cecum appears normal location.  No evidence of bowel wall thickening or dilatation.  No evidence of inflammatory process or abnormal fluid collections.  IMPRESSION:  1.  No acute findings within the abdomen or pelvis. 2.  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 3.  Small left pleural effusion and tiny pericardial effusion versus pericardial thickening.  These findings are uncertain etiology and clinical significance; consider chest radiograph for further evaluation.  Original Report Authenticated By: Danae Orleans, M.D.   Dg Abd Acute W/chest  03/03/2012  [facility] BY [facility] *RADIOLOGY REPORT*  Clinical Data: Abdominal pain, diarrhea  ACUTE ABDOMEN SERIES (ABDOMEN 2 VIEW & CHEST 1 VIEW)  Comparison: None.   Findings: No active infiltrate is seen.  There is some blunting of the right costophrenic angle which may be due to pleural thickening or small right pleural effusion.  Mild cardiomegaly is present.  No bony abnormality is seen.  Supine and erect views of the abdomen show a lower large and small bowel gas to be present.  No bowel obstruction is seen.  No free air is noted.  No opaque calculi are noted.  The bones appear normal.  IMPRESSION:  1.  No active lung disease.  Cannot exclude small right pleural effusion versus mild right pleural thickening blunting the right costophrenic angle. 2.  No bowel obstruction.  No free air.  Original Report Authenticated By: Juline Patch, M.D.     ASSESSMENT: This is a very pleasant 51 years old white female with anemia of unknown etiology. This could be secondary to nutritional deficiency secondary to ulcerative colitis versus immune mediated versus a bone marrow infiltration secondary to lymphoma as a result of long term immunotherapy.  PLAN: I have a lengthy discussion with the patient today about her condition. I recommended for her to following: #1 I ordered several studies today to identify the 20th of her anemia including repeat CBC, comprehensive metabolic panel, LDH, iron study, ferritin, erythropoietin, serum protein electrophoreses, haptoglobin, serum  B12 and serum folate. #2 I will arrange for the patient to have 2 units of red blood cells transfusion tomorrow. #3 if no clear etiology for her anemia from the previous blood work, I would consider the patient for a bone marrow biopsy and aspirate to rule out lymphoma or other infiltrative process of the bone marrow I gave the patient and her son the time to ask questions and answered them completely to their satisfactions.  All questions were answered. The patient knows to call the clinic with any problems, questions or concerns. We can certainly see the patient much sooner if necessary.  Thank you so  much for allowing me to participate in the care of Yesenia Crawford. I will continue to follow up the patient with you and assist in her care.  I spent 30 minutes counseling the patient face to face. The total time spent in the appointment was 55 minutes.   Derreck Wiltsey K. 03/28/2012, 6:49 PM

## 2012-03-29 ENCOUNTER — Telehealth: Payer: Self-pay | Admitting: Medical Oncology

## 2012-03-29 ENCOUNTER — Ambulatory Visit (HOSPITAL_BASED_OUTPATIENT_CLINIC_OR_DEPARTMENT_OTHER): Payer: BC Managed Care – PPO

## 2012-03-29 VITALS — BP 107/57 | HR 76 | Temp 97.6°F

## 2012-03-29 DIAGNOSIS — D649 Anemia, unspecified: Secondary | ICD-10-CM

## 2012-03-29 MED ORDER — SODIUM CHLORIDE 0.9 % IV SOLN: 250.0000 mL | Freq: Once | INTRAVENOUS | Status: DC

## 2012-03-29 MED ORDER — DIPHENHYDRAMINE HCL 50 MG/ML IJ SOLN
25.0000 mg | Freq: Once | INTRAMUSCULAR | Status: AC
  Administered 2012-03-29: 25 mg via INTRAVENOUS

## 2012-03-29 MED ORDER — ACETAMINOPHEN 325 MG PO TABS
650.0000 mg | ORAL_TABLET | Freq: Once | ORAL | Status: AC
  Administered 2012-03-29: 650 mg via ORAL

## 2012-03-29 NOTE — Telephone Encounter (Signed)
Per Dr Donnald Garre he will schedule pt after he gets all lab work back . I called pt and told her this.

## 2012-03-30 ENCOUNTER — Ambulatory Visit (INDEPENDENT_AMBULATORY_CARE_PROVIDER_SITE_OTHER): Payer: BC Managed Care – PPO | Admitting: General Surgery

## 2012-03-30 ENCOUNTER — Encounter (INDEPENDENT_AMBULATORY_CARE_PROVIDER_SITE_OTHER): Payer: Self-pay | Admitting: General Surgery

## 2012-03-30 VITALS — BP 104/76 | HR 71 | Temp 96.6°F | Ht 65.75 in | Wt 98.0 lb

## 2012-03-30 DIAGNOSIS — Z5189 Encounter for other specified aftercare: Secondary | ICD-10-CM

## 2012-03-30 DIAGNOSIS — Z4889 Encounter for other specified surgical aftercare: Secondary | ICD-10-CM

## 2012-03-30 LAB — PROTEIN ELECTROPHORESIS, SERUM
Albumin ELP: 49.3 % — ABNORMAL LOW (ref 55.8–66.1)
Alpha-2-Globulin: 14.5 % — ABNORMAL HIGH (ref 7.1–11.8)
Beta Globulin: 5.1 % (ref 4.7–7.2)
Total Protein, Serum Electrophoresis: 7.2 g/dL (ref 6.0–8.3)

## 2012-03-30 LAB — IRON AND TIBC
%SAT: 17 % — ABNORMAL LOW (ref 20–55)
Iron: 38 ug/dL — ABNORMAL LOW (ref 42–145)

## 2012-03-30 LAB — TYPE AND SCREEN
Unit division: 0
Unit division: 0

## 2012-03-30 LAB — VITAMIN B12: Vitamin B-12: 353 pg/mL (ref 211–911)

## 2012-03-30 LAB — HAPTOGLOBIN: Haptoglobin: 273 mg/dL — ABNORMAL HIGH (ref 45–215)

## 2012-03-30 LAB — ERYTHROPOIETIN: Erythropoietin: 28 m[IU]/mL (ref 2.6–34.0)

## 2012-03-30 NOTE — Progress Notes (Signed)
Subjective:     Patient ID: Yesenia Crawford, female   DOB: December 12, 1961, 51 y.o.   MRN: [id number]  HPI This patient follows up status post excision of right chest wall lipoma. She has had no issues since her surgery tendencies he doing well. She has no complaints. She did receive a blood transfusion for chronic anemia and feels better after this. She is undergoing workup by her oncologist Dr. Arbutus Ped  Review of Systems     Objective:   Physical Exam No acute distress and nontoxic-appearing  Her incision is healing well without any sign of infection there is no evidence of residual mass.    Assessment:     Status post excision of right chest wall lipoma-doing well Her pathology was benign and she seems to be doing well. There is no evidence of any postoperative complications    Plan:     She can followup on a p.r.n. basis. I recommended that she use some block and keep some of the incision until this is further healed.

## 2012-03-30 NOTE — Telephone Encounter (Signed)
Called pt and told her that Dr Donnald Garre will schedule her for f/u after all lab results received.

## 2012-04-03 ENCOUNTER — Telehealth: Payer: Self-pay | Admitting: Medical Oncology

## 2012-04-03 ENCOUNTER — Other Ambulatory Visit: Payer: Self-pay | Admitting: Medical Oncology

## 2012-04-03 DIAGNOSIS — D649 Anemia, unspecified: Secondary | ICD-10-CM

## 2012-04-03 NOTE — Telephone Encounter (Signed)
Temp 99 , feels lousy . Does she need to f/u with GI for her sigmoidoscopy /endoscopy. Per Dr Donnald Garre I ordered labs for am and told her to f/u with her GI provider . Pt voices understanding.

## 2012-04-04 ENCOUNTER — Telehealth: Payer: Self-pay | Admitting: Medical Oncology

## 2012-04-04 ENCOUNTER — Other Ambulatory Visit (HOSPITAL_BASED_OUTPATIENT_CLINIC_OR_DEPARTMENT_OTHER): Payer: BC Managed Care – PPO

## 2012-04-04 ENCOUNTER — Other Ambulatory Visit: Payer: Self-pay | Admitting: Medical Oncology

## 2012-04-04 DIAGNOSIS — D649 Anemia, unspecified: Secondary | ICD-10-CM

## 2012-04-04 LAB — CBC WITH DIFFERENTIAL/PLATELET
BASO%: 0.2 % (ref 0.0–2.0)
LYMPH%: 9.1 % — ABNORMAL LOW (ref 14.0–49.7)
MCHC: 33 g/dL (ref 31.5–36.0)
MONO#: 0.4 10*3/uL (ref 0.1–0.9)
Platelets: 190 10*3/uL (ref 145–400)
RBC: 3.51 10*6/uL — ABNORMAL LOW (ref 3.70–5.45)
RDW: 19.8 % — ABNORMAL HIGH (ref 11.2–14.5)
WBC: 7.2 10*3/uL (ref 3.9–10.3)
lymph#: 0.7 10*3/uL — ABNORMAL LOW (ref 0.9–3.3)
nRBC: 0 % (ref 0–0)

## 2012-04-04 NOTE — Telephone Encounter (Signed)
Called pt with [facility] and told her per Dr. Donnald Garre  She needs  to see primary care if still running fever. I scheduled f/u with Dr Donnald Garre for next Tuesday with labs at 0945. Pt notified.

## 2012-04-06 ENCOUNTER — Other Ambulatory Visit: Payer: Self-pay | Admitting: *Deleted

## 2012-04-06 ENCOUNTER — Telehealth: Payer: Self-pay | Admitting: Internal Medicine

## 2012-04-06 NOTE — Telephone Encounter (Signed)
l/m with appt info for 4/23  aom

## 2012-04-10 ENCOUNTER — Encounter: Payer: Self-pay | Admitting: Internal Medicine

## 2012-04-11 ENCOUNTER — Telehealth: Payer: Self-pay | Admitting: Internal Medicine

## 2012-04-11 ENCOUNTER — Telehealth: Payer: Self-pay | Admitting: Medical Oncology

## 2012-04-11 ENCOUNTER — Other Ambulatory Visit (HOSPITAL_BASED_OUTPATIENT_CLINIC_OR_DEPARTMENT_OTHER): Payer: BC Managed Care – PPO | Admitting: Lab

## 2012-04-11 ENCOUNTER — Encounter: Payer: Self-pay | Admitting: Internal Medicine

## 2012-04-11 ENCOUNTER — Ambulatory Visit (HOSPITAL_BASED_OUTPATIENT_CLINIC_OR_DEPARTMENT_OTHER): Payer: BC Managed Care – PPO | Admitting: Internal Medicine

## 2012-04-11 VITALS — BP 130/83 | HR 79 | Temp 97.5°F | Ht 65.75 in | Wt 99.3 lb

## 2012-04-11 DIAGNOSIS — D649 Anemia, unspecified: Secondary | ICD-10-CM

## 2012-04-11 DIAGNOSIS — D72819 Decreased white blood cell count, unspecified: Secondary | ICD-10-CM

## 2012-04-11 LAB — CBC WITH DIFFERENTIAL/PLATELET
Basophils Absolute: 0 10*3/uL (ref 0.0–0.1)
Eosinophils Absolute: 0 10*3/uL (ref 0.0–0.5)
HGB: 9.9 g/dL — ABNORMAL LOW (ref 11.6–15.9)
LYMPH%: 32.5 % (ref 14.0–49.7)
MCV: 91.3 fL (ref 79.5–101.0)
MONO#: 0.3 10*3/uL (ref 0.1–0.9)
NEUT#: 1.7 10*3/uL (ref 1.5–6.5)
Platelets: 213 10*3/uL (ref 145–400)
RBC: 3.26 10*6/uL — ABNORMAL LOW (ref 3.70–5.45)
RDW: 19.4 % — ABNORMAL HIGH (ref 11.2–14.5)
WBC: 2.9 10*3/uL — ABNORMAL LOW (ref 3.9–10.3)
nRBC: 0 % (ref 0–0)

## 2012-04-11 NOTE — Progress Notes (Signed)
[facility] Telephone:([phone]   Fax:([phone]  OFFICE PROGRESS NOTE  Benita Stabile, MD, MD [facility] [address], [address] [place] [address]  DIAGNOSIS: Iron deficiency anemia with Leukocytopenia.  PRIOR THERAPY: Status post 2 units of red blood cell transfusion.  CURRENT THERAPY: None  INTERVAL HISTORY: Yesenia Crawford 51 y.o. female returns to the clinic today for followup visit accompanied by her niece. The patient is feeling much better after she received 2 units of red blood cell transfusion last week. She is to have mild fatigue but no dizzy spells. She is scheduled to have repeat colonoscopy by her gastroenterologist next week.  She denied having any significant weight loss or night sweats. She continues to have occasional fever. She has repeat CBC performed earlier today and she is here for evaluation and discussion of her lab results.  MEDICAL HISTORY: Past Medical History  Diagnosis Date  . Ulcerative colitis 1981    HAS CURRENT NONBLOODY DIARRHEA X3 WKS  . IBS (irritable bowel syndrome)   . Ulcerative colitis     ALLERGIES:  is allergic to metronidazole.  MEDICATIONS:  Current Outpatient Prescriptions  Medication Sig Dispense Refill  . folic acid (FOLVITE) 1 MG tablet 1 mg daily.       . Multiple Vitamin (MULITIVITAMIN WITH MINERALS) TABS Take 1 tablet by mouth daily.      Marland Kitchen sulfaSALAzine (AZULFIDINE) 500 MG tablet 500 mg 4 (four) times daily.         SURGICAL HISTORY:  Past Surgical History  Procedure Date  . Breast cyst excision   . Ganglion cyst excision 01/31/2009    left wrist  . Dorsal compartment release 01/31/2009    release 1st dorsal compartment  . Mass excision 03/02/2012    Procedure: EXCISION MASS;  Surgeon: Lodema Pilot, DO;  Location: [facility];  Service: General;  Laterality: Right;  excison of right chest wall mass 3x3     REVIEW OF SYSTEMS:  A  comprehensive review of systems was negative except for: Constitutional: positive for fatigue   PHYSICAL EXAMINATION: General appearance: alert, cooperative and no distress Lymph nodes: Cervical, supraclavicular, and axillary nodes normal. Resp: clear to auscultation bilaterally Cardio: regular rate and rhythm, S1, S2 normal, no murmur, click, rub or gallop GI: soft, non-tender; bowel sounds normal; no masses,  no organomegaly Extremities: extremities normal, atraumatic, no cyanosis or edema  ECOG PERFORMANCE STATUS: 1 - Symptomatic but completely ambulatory  Blood pressure 130/83, pulse 79, temperature 97.5 F (36.4 C), temperature source Oral, height 5' 5.75" (1.67 m), weight 99 lb 4.8 oz (45.042 kg).  LABORATORY DATA: Lab Results  Component Value Date   WBC 2.9* 04/11/2012   HGB 9.9* 04/11/2012   HCT 29.8* 04/11/2012   MCV 91.3 04/11/2012   PLT 213 04/11/2012      Chemistry   No results found for this basename: NA, K, CL, CO2, BUN, CREATININE, GLU   No results found for this basename: CALCIUM, ALKPHOS, AST, ALT, BILITOT       RADIOGRAPHIC STUDIES: Ct Abdomen Pelvis W Contrast  03/17/2012  *RADIOLOGY REPORT*  Clinical Data: Left-sided chest pain.  Decreased hemoglobin. Diarrhea.  Nausea and vomiting.  Low grade fever.  Recent greater than 10 x 1 loss.  CT ABDOMEN AND PELVIS WITH CONTRAST  Technique:  Multidetector CT imaging of the abdomen and pelvis was performed following the standard protocol during bolus administration of intravenous contrast.  Contrast:  OMNIPAQUE IOHEXOL 300 MG/ML IJ SOLN  Comparison: None.  Findings: Images through the lung bases show a small left-sided pleural effusion and mild left basilar atelectasis.  There is also a tiny pericardial effusion versus pericardial thickening.  The abdominal parenchymal organs are normal in appearance. Gallbladder is unremarkable.  No evidence of hydronephrosis.  No soft tissue masses or lymphadenopathy are identified within  the abdomen or pelvis.  Uterus and adnexa are unremarkable.  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  The cecum appears normal location.  No evidence of bowel wall thickening or dilatation.  No evidence of inflammatory process or abnormal fluid collections.  IMPRESSION:  1.  No acute findings within the abdomen or pelvis. 2.  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 3.  Small left pleural effusion and tiny pericardial effusion versus pericardial thickening.  These findings are uncertain etiology and clinical significance; consider chest radiograph for further evaluation.  Original Report Authenticated By: Danae Orleans, M.D.    ASSESSMENT: This is a very pleasant 51 years old white female with a history of severe anemia most likely secondary to iron deficiency secondary to ulcerative colitis, but the patient also has leukocytopenia concerning for bone marrow involvement. Her hemoglobin and hematocrit are stable today.  PLAN: I discussed the lab result with the patient and her niece. I recommended for her to proceed with a bone marrow biopsy and aspirate later this week to rule out any bone marrow involvement with leukemia or lymphoma. The patient is also scheduled for repeat colonoscopy by her gastroenterologist. Her previous blood work was completely normal except for the low iron. I would see her back for followup visit in 2 weeks for evaluation and discussion of treatment options based on her biopsy results. All questions were answered. The patient knows to call the clinic with any problems, questions or concerns. We can certainly see the patient much sooner if necessary.

## 2012-04-11 NOTE — Progress Notes (Signed)
Put fmla form on nurse's desk °

## 2012-04-11 NOTE — Telephone Encounter (Signed)
gv pt appt for april and may2013

## 2012-04-11 NOTE — Telephone Encounter (Signed)
Scheduled bone marrow aspiration and biopsy with Claris Che for 4/26 at 0830 in the infusion room . Called to schedule with Marcelino Duster. onc treatmetn schedule request sent. Pt notified of appointment

## 2012-04-12 ENCOUNTER — Encounter: Payer: Self-pay | Admitting: Internal Medicine

## 2012-04-12 NOTE — Progress Notes (Signed)
Faxed fmla papers to [facility] @ [phone]; put originals in the registration desk.

## 2012-04-14 ENCOUNTER — Ambulatory Visit: Payer: BC Managed Care – PPO | Admitting: Lab

## 2012-04-14 ENCOUNTER — Other Ambulatory Visit: Payer: BC Managed Care – PPO | Admitting: Lab

## 2012-04-14 ENCOUNTER — Other Ambulatory Visit (HOSPITAL_COMMUNITY)
Admission: RE | Admit: 2012-04-14 | Discharge: 2012-04-14 | Disposition: A | Discharge status: Home / Self Care | Payer: BC Managed Care – PPO | Source: Ambulatory Visit | Attending: Internal Medicine | Admitting: Internal Medicine

## 2012-04-14 ENCOUNTER — Encounter (HOSPITAL_BASED_OUTPATIENT_CLINIC_OR_DEPARTMENT_OTHER): Payer: Self-pay | Admitting: Internal Medicine

## 2012-04-14 ENCOUNTER — Ambulatory Visit (HOSPITAL_BASED_OUTPATIENT_CLINIC_OR_DEPARTMENT_OTHER): Payer: BC Managed Care – PPO | Admitting: Internal Medicine

## 2012-04-14 VITALS — BP 134/83 | HR 81 | Temp 97.2°F

## 2012-04-14 VITALS — BP 122/80 | HR 65 | Temp 98.7°F

## 2012-04-14 DIAGNOSIS — D509 Iron deficiency anemia, unspecified: Secondary | ICD-10-CM

## 2012-04-14 DIAGNOSIS — D649 Anemia, unspecified: Secondary | ICD-10-CM | POA: Insufficient documentation

## 2012-04-14 DIAGNOSIS — D72819 Decreased white blood cell count, unspecified: Secondary | ICD-10-CM

## 2012-04-14 DIAGNOSIS — D638 Anemia in other chronic diseases classified elsewhere: Secondary | ICD-10-CM

## 2012-04-14 LAB — DIFFERENTIAL
Eosinophils Relative: 0 % (ref 0–5)
Lymphocytes Relative: 26 % (ref 12–46)
Monocytes Absolute: 0.3 10*3/uL (ref 0.1–1.0)
Monocytes Relative: 8 % (ref 3–12)
Neutro Abs: 2.5 10*3/uL (ref 1.7–7.7)

## 2012-04-14 LAB — CBC
HCT: 28.4 % — ABNORMAL LOW (ref 36.0–46.0)
Hemoglobin: 9.3 g/dL — ABNORMAL LOW (ref 12.0–15.0)
MCV: 90.7 fL (ref 78.0–100.0)
WBC: 3.9 10*3/uL — ABNORMAL LOW (ref 4.0–10.5)

## 2012-04-14 NOTE — Progress Notes (Signed)
erroneous

## 2012-04-14 NOTE — Progress Notes (Signed)
[facility] BONE MARROW BIOPSY/ASPIRATE PROGRESS NOTE  Zendaya D Friscia presents for Bone Marrow biopsy per MD orders. Jac Canavan verbalized understanding of procedure. Consent reviewed and signed.  Shalese D Goodspeed positioned left side for procedure. Time-out performed and Bone Marrow Checklist. Procedure began at 8:35 AM. Xylocaine 2% 10 cc used for local and administered to patient by 8:40 AM. Procedure completed at 8:50 AM. Patient tolerated well. Pressure dressing applied to the right hip with instructions to leave in place for 24 hours. Patient instructed to report any bleeding that saturates dressing and to take pain medication as needed. Dressing dry and intact to the right hip on discharge. Specimen sent to Pathology for Morphology, Flow Cytometry and Cytogenetics.

## 2012-04-14 NOTE — Patient Instructions (Signed)
Bone Marrow Aspiration and Bone Biopsy Bone Marrow Aspiration, Bone Marrow Biopsy Care After Read the instructions outlined below and refer to this sheet in the next few weeks. These discharge instructions provide you with general information on caring for yourself after you leave the hospital. Your caregiver may also give you specific instructions. While your treatment has been planned according to the most current medical practices available, unavoidable complications occasionally occur. If you have any problems or questions after discharge, call your caregiver. FINDING OUT THE RESULTS OF YOUR TEST Not all test results are available during your visit. If your test results are not back during the visit, make an appointment with your caregiver to find out the results. Do not assume everything is normal if you have not heard from your caregiver or the medical facility. It is important for you to follow up on all of your test results.  HOME CARE INSTRUCTIONS   Keep your dressing clean and dry. You may replace dressing with a bandage after 24 hours.   You may take a bath or shower after 24 hours.   Use an ice pack for 20 minutes every 2 hours while awake for pain as needed.  SEEK MEDICAL CARE IF:   There is redness, swelling, or increasing pain at the biopsy site.   There is pus coming from the biopsy site.   There is drainage from a biopsy site lasting longer than one day.   An unexplained oral temperature above 102 F (38.9 C) develops.  SEEK IMMEDIATE MEDICAL CARE IF:   You develop a rash.   You have difficulty breathing.   You develop any reaction or side effects to medications given.  Document Released: 06/25/2005 Document Revised: 11/25/2011 Document Reviewed: 12/03/2008 [facility] Patient Information 2012 [place], [place].

## 2012-04-17 ENCOUNTER — Encounter: Payer: Self-pay | Admitting: *Deleted

## 2012-04-17 NOTE — Progress Notes (Signed)
BMBX report given to Dr MKM to review.  SLJ 

## 2012-04-18 ENCOUNTER — Other Ambulatory Visit: Payer: Self-pay | Admitting: Gastroenterology

## 2012-04-27 ENCOUNTER — Ambulatory Visit (HOSPITAL_BASED_OUTPATIENT_CLINIC_OR_DEPARTMENT_OTHER): Payer: BC Managed Care – PPO | Admitting: Internal Medicine

## 2012-04-27 ENCOUNTER — Other Ambulatory Visit (HOSPITAL_BASED_OUTPATIENT_CLINIC_OR_DEPARTMENT_OTHER): Payer: BC Managed Care – PPO | Admitting: Lab

## 2012-04-27 ENCOUNTER — Telehealth: Payer: Self-pay | Admitting: Internal Medicine

## 2012-04-27 VITALS — BP 124/81 | HR 77 | Temp 97.3°F | Ht 65.75 in | Wt 102.1 lb

## 2012-04-27 DIAGNOSIS — D72819 Decreased white blood cell count, unspecified: Secondary | ICD-10-CM

## 2012-04-27 DIAGNOSIS — D649 Anemia, unspecified: Secondary | ICD-10-CM

## 2012-04-27 LAB — CBC & DIFF AND RETIC
BASO%: 0 % (ref 0.0–2.0)
Eosinophils Absolute: 0 10*3/uL (ref 0.0–0.5)
MCHC: 33.3 g/dL (ref 31.5–36.0)
MCV: 90.8 fL (ref 79.5–101.0)
MONO#: 0.4 10*3/uL (ref 0.1–0.9)
MONO%: 13.2 % (ref 0.0–14.0)
NEUT#: 1.8 10*3/uL (ref 1.5–6.5)
RBC: 3.37 10*6/uL — ABNORMAL LOW (ref 3.70–5.45)
RDW: 17.1 % — ABNORMAL HIGH (ref 11.2–14.5)
Retic %: 2 % (ref 0.70–2.10)
Retic Ct Abs: 67.4 10*3/uL (ref 33.70–90.70)
WBC: 3.3 10*3/uL — ABNORMAL LOW (ref 3.9–10.3)

## 2012-04-27 MED ORDER — INTEGRA PLUS PO CAPS: 1.0000 | ORAL_CAPSULE | Freq: Every day | ORAL | Status: DC

## 2012-04-27 NOTE — Telephone Encounter (Signed)
Appts made and printed for pt  aom 

## 2012-04-27 NOTE — Progress Notes (Signed)
[facility] Telephone:([phone]   Fax:(336) [phone]  OFFICE PROGRESS NOTE  Benita Stabile, MD, MD [facility] [address], [address] [place] [address]  DIAGNOSIS: Anemia of chronic disease +/- iron deficiency.  PRIOR THERAPY: None  CURRENT THERAPY: None  INTERVAL HISTORY: Yesenia Crawford 51 y.o. female returns to the clinic today for followup visit accompanied by family members. The patient is doing fine today with no specific complaints. She had a bone marrow biopsy and aspirate performed recently that showed no significant abnormalities. She also had EGD and sigmoidoscopy by Dr. Laural Benes but I don't have a report of this procedure yet. The patient is doing fine except for mild fatigue. No significant drop in her hemoglobin and hematocrit over the last few weeks.  MEDICAL HISTORY: Past Medical History  Diagnosis Date  . Ulcerative colitis 1981    HAS CURRENT NONBLOODY DIARRHEA X3 WKS  . IBS (irritable bowel syndrome)   . Ulcerative colitis     ALLERGIES:  is allergic to metronidazole.  MEDICATIONS:  Current Outpatient Prescriptions  Medication Sig Dispense Refill  . folic acid (FOLVITE) 1 MG tablet 1 mg daily.       . Multiple Vitamin (MULITIVITAMIN WITH MINERALS) TABS Take 1 tablet by mouth daily.      Marland Kitchen sulfaSALAzine (AZULFIDINE) 500 MG tablet 500 mg 4 (four) times daily.       Marland Kitchen FeFum-FePoly-FA-B Cmp-C-Biot (INTEGRA PLUS) CAPS Take 1 capsule by mouth daily.  30 capsule  2    SURGICAL HISTORY:  Past Surgical History  Procedure Date  . Breast cyst excision   . Ganglion cyst excision 01/31/2009    left wrist  . Dorsal compartment release 01/31/2009    release 1st dorsal compartment  . Mass excision 03/02/2012    Procedure: EXCISION MASS;  Surgeon: Lodema Pilot, DO;  Location: [facility];  Service: General;  Laterality: Right;  excison of right chest wall mass 3x3     REVIEW OF SYSTEMS:  A  comprehensive review of systems was negative except for: Constitutional: positive for fatigue   PHYSICAL EXAMINATION: General appearance: alert, cooperative, fatigued and no distress Head: Normocephalic, without obvious abnormality, atraumatic Neck: no adenopathy Lymph nodes: Cervical, supraclavicular, and axillary nodes normal. Resp: clear to auscultation bilaterally Cardio: regular rate and rhythm, S1, S2 normal, no murmur, click, rub or gallop GI: soft, non-tender; bowel sounds normal; no masses,  no organomegaly Extremities: extremities normal, atraumatic, no cyanosis or edema  ECOG PERFORMANCE STATUS: 1 - Symptomatic but completely ambulatory  Blood pressure 124/81, pulse 77, temperature 97.3 F (36.3 C), temperature source Oral, height 5' 5.75" (1.67 m), weight 102 lb 1.6 oz (46.312 kg).  LABORATORY DATA: Lab Results  Component Value Date   WBC 3.3* 04/27/2012   HGB 10.2* 04/27/2012   HCT 30.6* 04/27/2012   MCV 90.8 04/27/2012   PLT 152 04/27/2012      Chemistry   No results found for this basename: NA, K, CL, CO2, BUN, CREATININE, GLU   No results found for this basename: CALCIUM, ALKPHOS, AST, ALT, BILITOT       RADIOGRAPHIC STUDIES: No results found.  ASSESSMENT: This is very pleasant 51 years old white female was anemia most likely anemia of chronic disease +/- iron deficiency from her ulcerative colitis  PLAN: I discussed you that result with the patient and her family. I recommended for her to start taking oral iron tablets at a regular basis  in the form of Integra plus 1 capsule by mouth daily. I gave the patient a prescription for #30 with 2 refills. I would see her back for followup visit in 3 months with repeat CBC and iron study.  She was advised to call me immediately if she has any concerning symptoms in the interval.  All questions were answered. The patient knows to call the clinic with any problems, questions or concerns. We can certainly see the patient much  sooner if necessary.

## 2012-07-31 ENCOUNTER — Ambulatory Visit (HOSPITAL_BASED_OUTPATIENT_CLINIC_OR_DEPARTMENT_OTHER): Payer: BC Managed Care – PPO | Admitting: Internal Medicine

## 2012-07-31 ENCOUNTER — Telehealth: Payer: Self-pay | Admitting: Internal Medicine

## 2012-07-31 ENCOUNTER — Other Ambulatory Visit (HOSPITAL_BASED_OUTPATIENT_CLINIC_OR_DEPARTMENT_OTHER): Payer: BC Managed Care – PPO | Admitting: Lab

## 2012-07-31 VITALS — BP 120/84 | HR 102 | Temp 97.0°F | Resp 20 | Ht 65.75 in | Wt 104.7 lb

## 2012-07-31 DIAGNOSIS — D638 Anemia in other chronic diseases classified elsewhere: Secondary | ICD-10-CM

## 2012-07-31 DIAGNOSIS — D649 Anemia, unspecified: Secondary | ICD-10-CM

## 2012-07-31 LAB — CBC & DIFF AND RETIC
Basophils Absolute: 0 10*3/uL (ref 0.0–0.1)
Eosinophils Absolute: 0 10*3/uL (ref 0.0–0.5)
HCT: 34 % — ABNORMAL LOW (ref 34.8–46.6)
HGB: 12 g/dL (ref 11.6–15.9)
Immature Retic Fract: 7.4 % (ref 1.60–10.00)
LYMPH%: 24.2 % (ref 14.0–49.7)
MCHC: 35.3 g/dL (ref 31.5–36.0)
MONO#: 0.4 10*3/uL (ref 0.1–0.9)
NEUT%: 65.9 % (ref 38.4–76.8)
Platelets: 185 10*3/uL (ref 145–400)
WBC: 3.6 10*3/uL — ABNORMAL LOW (ref 3.9–10.3)
lymph#: 0.9 10*3/uL (ref 0.9–3.3)

## 2012-07-31 LAB — IRON AND TIBC: UIBC: 57 ug/dL — ABNORMAL LOW (ref 125–400)

## 2012-07-31 NOTE — Telephone Encounter (Signed)
appts made and  Printed for pt    aom °

## 2012-07-31 NOTE — Progress Notes (Signed)
[facility] [phone]   [phone]  OFFICE PROGRESS NOTE  Benita Stabile, MD [facility] [address] [place] [address]  DIAGNOSIS: Anemia of chronic disease plus/minus iron deficiency.  PRIOR THERAPY: Integra plus 1 capsule by mouth daily for one month.  CURRENT THERAPY: None.  INTERVAL HISTORY: Yesenia Crawford 51 y.o. female returns to the clinic today for routine three-month followup visit. The patient is feeling fine today with no specific complaints. She has more energy. She was treated with Integra plus for one month but she did not renew her prescription. She denied having any significant fatigue or weakness. She has no significant weight loss or night sweats. No chest pain or shortness of breath. The patient has repeat CBC and iron study performed earlier today and she is here for evaluation and discussion of her lab results.  MEDICAL HISTORY: Past Medical History  Diagnosis Date  . Ulcerative colitis 1981    HAS CURRENT NONBLOODY DIARRHEA X3 WKS  . IBS (irritable bowel syndrome)   . Ulcerative colitis     ALLERGIES:  is allergic to metronidazole.  MEDICATIONS:  Current Outpatient Prescriptions  Medication Sig Dispense Refill  . folic acid (FOLVITE) 1 MG tablet 1 mg daily.       . Multiple Vitamin (MULITIVITAMIN WITH MINERALS) TABS Take 1 tablet by mouth daily.      Marland Kitchen FeFum-FePoly-FA-B Cmp-C-Biot (INTEGRA PLUS) CAPS Take 1 capsule by mouth daily.  30 capsule  2  . sulfaSALAzine (AZULFIDINE) 500 MG tablet Take 500 mg by mouth Daily.        SURGICAL HISTORY:  Past Surgical History  Procedure Date  . Breast cyst excision   . Ganglion cyst excision 01/31/2009    left wrist  . Dorsal compartment release 01/31/2009    release 1st dorsal compartment  . Mass excision 03/02/2012    Procedure: EXCISION MASS;  Surgeon: Lodema Pilot, DO;  Location: [facility];  Service:  General;  Laterality: Right;  excison of right chest wall mass 3x3     REVIEW OF SYSTEMS:  A comprehensive review of systems was negative.   PHYSICAL EXAMINATION: General appearance: alert, cooperative and no distress Neck: no adenopathy Lymph nodes: Cervical, supraclavicular, and axillary nodes normal. Resp: clear to auscultation bilaterally Cardio: regular rate and rhythm, S1, S2 normal, no murmur, click, rub or gallop GI: soft, non-tender; bowel sounds normal; no masses,  no organomegaly Extremities: extremities normal, atraumatic, no cyanosis or edema  ECOG PERFORMANCE STATUS: 1 - Symptomatic but completely ambulatory  Blood pressure 120/84, pulse 102, temperature 97 F (36.1 C), temperature source Oral, resp. rate 20, height 5' 5.75" (1.67 m), weight 104 lb 11.2 oz (47.492 kg).  LABORATORY DATA: Lab Results  Component Value Date   WBC 3.6* 07/31/2012   HGB 12.0 07/31/2012   HCT 34.0* 07/31/2012   MCV 96.6 07/31/2012   PLT 185 07/31/2012      Chemistry   No results found for this basename: NA, K, CL, CO2, BUN, CREATININE, GLU   No results found for this basename: CALCIUM, ALKPHOS, AST, ALT, BILITOT       RADIOGRAPHIC STUDIES: No results found.  ASSESSMENT: This is a very pleasant 51 years old white female with history of anemia of chronic disease plus/minus deficiency status post 1 months of treatment with Integra plus with significant improvement in her anemia. The patient is doing fine today with normal hemoglobin and hematocrit.  She also has improvement in her total white blood count.  PLAN: I discussed the lab result with the patient and recommended for her to resume treatment with Integra plus 1 capsule by mouth daily. I would see her back for followup visit in 3 months with repeat CBC and iron study. She was advised to call me immediately if she has any concerning symptoms in the interval.  All questions were answered. The patient knows to call the clinic with any  problems, questions or concerns. We can certainly see the patient much sooner if necessary.

## 2012-09-08 ENCOUNTER — Emergency Department (HOSPITAL_COMMUNITY)
Admission: EM | Admit: 2012-09-08 | Discharge: 2012-09-08 | Disposition: A | Discharge status: Home / Self Care | Payer: Self-pay | Source: Home / Self Care | Attending: Emergency Medicine | Admitting: Emergency Medicine

## 2012-09-08 ENCOUNTER — Encounter (HOSPITAL_COMMUNITY): Payer: Self-pay

## 2012-09-08 ENCOUNTER — Emergency Department (INDEPENDENT_AMBULATORY_CARE_PROVIDER_SITE_OTHER): Payer: BC Managed Care – PPO

## 2012-09-08 DIAGNOSIS — M109 Gout, unspecified: Secondary | ICD-10-CM

## 2012-09-08 LAB — CBC WITH DIFFERENTIAL/PLATELET
Basophils Absolute: 0 10*3/uL (ref 0.0–0.1)
Eosinophils Relative: 0 % (ref 0–5)
HCT: 31.7 % — ABNORMAL LOW (ref 36.0–46.0)
Hemoglobin: 10.8 g/dL — ABNORMAL LOW (ref 12.0–15.0)
Lymphocytes Relative: 29 % (ref 12–46)
Lymphs Abs: 1.2 10*3/uL (ref 0.7–4.0)
MCV: 99.7 fL (ref 78.0–100.0)
Monocytes Absolute: 0.4 10*3/uL (ref 0.1–1.0)
Neutro Abs: 2.4 10*3/uL (ref 1.7–7.7)
RBC: 3.18 MIL/uL — ABNORMAL LOW (ref 3.87–5.11)
WBC: 3.9 10*3/uL — ABNORMAL LOW (ref 4.0–10.5)

## 2012-09-08 MED ORDER — COLCHICINE 0.6 MG PO TABS: ORAL_TABLET | ORAL | Status: DC

## 2012-09-08 MED ORDER — PREDNISONE 5 MG PO KIT: 1.0000 | PACK | Freq: Every day | ORAL | Status: DC

## 2012-09-08 NOTE — ED Provider Notes (Signed)
Chief Complaint  Patient presents with  . Foot Pain    History of Present Illness:   Yesenia Crawford is a 51 year old female with a history of ulcerative colitis who is on Azulfidine. She has had a four-day history of pain, redness, and swelling of the lateral aspect of the right foot and ankle. She denies any injury to the area. She's had no fever or chills. No bites or stings. No breaking the skin. She denies any prior history of similar episodes or history of gout or arthritis. The foot is painful with movement and with ambulation and better if she rests or gets off of it. She denies any skin rash or diarrhea.  Review of Systems:  Other than noted above, the patient denies any of the following symptoms: Systemic:  No fevers, chills, sweats, or aches.  No fatigue or tiredness. Musculoskeletal:  No joint pain, arthritis, bursitis, swelling, back pain, or neck pain. Neurological:  No muscular weakness, paresthesias, headache, or trouble with speech or coordination.  No dizziness.  PMFSH:  Past medical history, family history, social history, meds, and allergies were reviewed. No personal or family history of gout.  Physical Exam:   Vital signs:  BP 131/80  Pulse 95  Temp 98.3 F (36.8 C) (Oral)  Resp 18  SpO2 100% Gen:  Alert and oriented times 3.  In no distress. Musculoskeletal: There is swelling, erythema, and tenderness to palpation over lateral aspect of the right ankle, extending down onto the lateral aspect of the dorsum of the foot. There is no swelling, redness or tenderness to palpation medially and no tenderness to palpation over the MTP joints. She does have pain on movement of the ankle. Joint survey was otherwise unremarkable. Otherwise, all joints had a full a ROM with no swelling, bruising or deformity.  No edema, pulses full. Extremities were warm and pink.  Capillary refill was brisk.  Skin:  Clear, warm and dry.  No rash. Neuro:  Alert and oriented times 3.  Muscle strength was  normal.  Sensation was intact to light touch.     Radiology:  Dg Ankle Complete Right  09/08/2012  *RADIOLOGY REPORT*  Clinical Data: Pain and swelling in the right ankle.  RIGHT ANKLE - COMPLETE 3+ VIEW  Comparison: No priors.  Findings: Three views of the right ankle demonstrate no acute fracture, subluxation, dislocation, joint or soft tissue abnormality.  IMPRESSION: 1.  No acute radiographic abnormality of the right ankle.   Original Report Authenticated By: Florencia Reasons, M.D.    Dg Foot Complete Right  09/08/2012  *RADIOLOGY REPORT*  Clinical Data: Pain swelling of the right foot for the past 5 days.  RIGHT FOOT COMPLETE - 3+ VIEW  Comparison: No priors.  Findings: Three views of right foot demonstrate no acute fracture, subluxation, dislocation, joint or soft tissue abnormality.  IMPRESSION: 1.  No acute radiographic abnormality of the right foot to account for the patient's symptoms.   Original Report Authenticated By: Florencia Reasons, M.D.    I reviewed the images independently and personally and concur with the radiologist's findings.  Results for orders placed during the hospital encounter of 09/08/12  CBC WITH DIFFERENTIAL      Component Value Range   WBC 3.9 (*) 4.0 - 10.5 K/uL   RBC 3.18 (*) 3.87 - 5.11 MIL/uL   Hemoglobin 10.8 (*) 12.0 - 15.0 g/dL   HCT 21.3 (*) 08.6 - 57.8 %   MCV 99.7  78.0 - 100.0 fL  MCH 34.0  26.0 - 34.0 pg   MCHC 34.1  30.0 - 36.0 g/dL   RDW 40.1  02.7 - 25.3 %   Platelets 152  150 - 400 K/uL   Neutrophils Relative 61  43 - 77 %   Neutro Abs 2.4  1.7 - 7.7 K/uL   Lymphocytes Relative 29  12 - 46 %   Lymphs Abs 1.2  0.7 - 4.0 K/uL   Monocytes Relative 10  3 - 12 %   Monocytes Absolute 0.4  0.1 - 1.0 K/uL   Eosinophils Relative 0  0 - 5 %   Eosinophils Absolute 0.0  0.0 - 0.7 K/uL   Basophils Relative 0  0 - 1 %   Basophils Absolute 0.0  0.0 - 0.1 K/uL  URIC ACID      Component Value Range   Uric Acid, Serum 4.1  2.4 - 7.0 mg/dL    Assessment:  The encounter diagnosis was Gout.  Even though are uric acid level was normal, I think it's possible she could have gout. It did not appear to be traumatic or infectious. It's possible that this could be related to her ulcerative colitis.  Plan:   1.  The following meds were prescribed:   New Prescriptions   COLCHICINE 0.6 MG TABLET    Take 2 now and 1 in 1 hour.  May repeat dose once daily.  For gout attack.   PREDNISONE 5 MG KIT    Take 1 kit (5 mg total) by mouth daily after breakfast. Prednisone 5 mg 6 day dosepack.  Take as directed.   2.  The patient was instructed in symptomatic care, including rest and activity, elevation, application of ice and compression.  Appropriate handouts were given. 3.  The patient was told to return if becoming worse in any way, if no better in 3 or 4 days, and given some red flag symptoms that would indicate earlier return.   4.  The patient was told to follow up with her primary care physician, Dr. Lupe Carney early next week.   Reuben Likes, MD 09/08/12 [phone]

## 2012-09-08 NOTE — ED Notes (Signed)
Foot pain and swelling since 9-18, worse today; foot warm and red to touch ; no known injury

## 2012-09-09 NOTE — ED Notes (Signed)
Patient called asking about lab results.  Patient reported unable to see results on "my chart".  Informed once labs "released " by physician, patient would be able to review results.  Informed labs normal per dr Lorenz Coaster.  Informed normal uric acid does not contradict gout diagnosis.  Patient reported medicine "seems to be helping"

## 2012-10-27 ENCOUNTER — Telehealth: Payer: Self-pay | Admitting: Internal Medicine

## 2012-10-27 NOTE — Telephone Encounter (Signed)
pt called and is out of school on 11/11 will need to do lab and visit same day   aom

## 2012-10-27 NOTE — Telephone Encounter (Signed)
l/m that per sj have the pt do her lab a day early    aom

## 2012-10-30 ENCOUNTER — Ambulatory Visit (HOSPITAL_BASED_OUTPATIENT_CLINIC_OR_DEPARTMENT_OTHER): Payer: BC Managed Care – PPO | Admitting: Internal Medicine

## 2012-10-30 ENCOUNTER — Other Ambulatory Visit (HOSPITAL_BASED_OUTPATIENT_CLINIC_OR_DEPARTMENT_OTHER): Payer: BC Managed Care – PPO

## 2012-10-30 ENCOUNTER — Telehealth: Payer: Self-pay | Admitting: Internal Medicine

## 2012-10-30 ENCOUNTER — Ambulatory Visit: Payer: BC Managed Care – PPO | Admitting: Internal Medicine

## 2012-10-30 DIAGNOSIS — D649 Anemia, unspecified: Secondary | ICD-10-CM

## 2012-10-30 DIAGNOSIS — D509 Iron deficiency anemia, unspecified: Secondary | ICD-10-CM

## 2012-10-30 LAB — CBC WITH DIFFERENTIAL/PLATELET
Basophils Absolute: 0 10*3/uL (ref 0.0–0.1)
EOS%: 0 % (ref 0.0–7.0)
Eosinophils Absolute: 0 10*3/uL (ref 0.0–0.5)
HCT: 34.7 % — ABNORMAL LOW (ref 34.8–46.6)
HGB: 11.8 g/dL (ref 11.6–15.9)
MCH: 33.1 pg (ref 25.1–34.0)
MCV: 97.5 fL (ref 79.5–101.0)
MONO%: 7.1 % (ref 0.0–14.0)
NEUT#: 3 10*3/uL (ref 1.5–6.5)
NEUT%: 74.7 % (ref 38.4–76.8)
RDW: 12 % (ref 11.2–14.5)
lymph#: 0.7 10*3/uL — ABNORMAL LOW (ref 0.9–3.3)

## 2012-10-30 LAB — IRON AND TIBC
%SAT: 21 % (ref 20–55)
Iron: 54 ug/dL (ref 42–145)
UIBC: 202 ug/dL (ref 125–400)

## 2012-10-30 NOTE — Telephone Encounter (Signed)
Gave pt appt for February 2014 lab before MD

## 2012-10-30 NOTE — Progress Notes (Signed)
[facility] Telephone:([phone]   Fax:(336) [phone]  OFFICE PROGRESS NOTE  Benita Stabile, MD [facility] [address], [address] [place] [address]  DIAGNOSIS: Anemia of chronic disease plus/minus iron deficiency.   PRIOR THERAPY: Integra plus 1 capsule by mouth daily for one month.   CURRENT THERAPY: None.  INTERVAL HISTORY: Yesenia Crawford 51 y.o. female returns to the clinic today for routine three-month followup visit. The patient is feeling fine today with no specific complaints except for a gouty episode few days ago that resolved quickly after treatment with colchicine. The patient denied having any significant fatigue or weakness. Denied having any chest pain, shortness breath, cough or hemoptysis. She has no bleeding issues or dizzy spells. She has repeat CBC and iron study earlier today and she is here for evaluation and discussion of her lab results.  MEDICAL HISTORY: Past Medical History  Diagnosis Date  . Ulcerative colitis 1981    HAS CURRENT NONBLOODY DIARRHEA X3 WKS  . IBS (irritable bowel syndrome)   . Ulcerative colitis     ALLERGIES:  is allergic to metronidazole.  MEDICATIONS:  Current Outpatient Prescriptions  Medication Sig Dispense Refill  . colchicine 0.6 MG tablet Take 2 now and 1 in 1 hour.  May repeat dose once daily.  For gout attack.  12 tablet  0  . FeFum-FePoly-FA-B Cmp-C-Biot (INTEGRA PLUS) CAPS Take 1 capsule by mouth daily.  30 capsule  2  . folic acid (FOLVITE) 1 MG tablet 1 mg daily.       . Multiple Vitamin (MULITIVITAMIN WITH MINERALS) TABS Take 1 tablet by mouth daily.      . PredniSONE 5 MG KIT Take 1 kit (5 mg total) by mouth daily after breakfast. Prednisone 5 mg 6 day dosepack.  Take as directed.  1 kit  0  . sulfaSALAzine (AZULFIDINE) 500 MG tablet Take 500 mg by mouth Daily.        SURGICAL HISTORY:  Past Surgical History  Procedure Date  . Breast cyst excision     . Ganglion cyst excision 01/31/2009    left wrist  . Dorsal compartment release 01/31/2009    release 1st dorsal compartment  . Mass excision 03/02/2012    Procedure: EXCISION MASS;  Surgeon: Lodema Pilot, DO;  Location: [facility];  Service: General;  Laterality: Right;  excison of right chest wall mass 3x3     REVIEW OF SYSTEMS:  A comprehensive review of systems was negative.   PHYSICAL EXAMINATION: General appearance: alert, cooperative and no distress Head: Normocephalic, without obvious abnormality, atraumatic Lymph nodes: Cervical, supraclavicular, and axillary nodes normal. Resp: clear to auscultation bilaterally Cardio: regular rate and rhythm, S1, S2 normal, no murmur, click, rub or gallop GI: soft, non-tender; bowel sounds normal; no masses,  no organomegaly Extremities: extremities normal, atraumatic, no cyanosis or edema  ECOG PERFORMANCE STATUS: 0 - Asymptomatic  There were no vitals taken for this visit.  LABORATORY DATA: Lab Results  Component Value Date   WBC 4.0 10/30/2012   HGB 11.8 10/30/2012   HCT 34.7* 10/30/2012   MCV 97.5 10/30/2012   PLT 150 10/30/2012      Chemistry   No results found for this basename: NA, K, CL, CO2, BUN, CREATININE, GLU   No results found for this basename: CALCIUM, ALKPHOS, AST, ALT, BILITOT       RADIOGRAPHIC STUDIES: No results found.  ASSESSMENT: This is a very pleasant  51 years old white female with history of iron deficiency anemia status post Feraheme infusion and currently on observation. The patient is doing fine with no specific complaints. She is not compliant with her oral iron tablets because it upset her stomach. Her iron study today still pending.  PLAN: I discussed the lab result with the patient today. I recommended for her to continue on observation for now with repeat CBC and iron study in 3 months. If her iron study today showed any significant iron deficiency I will call her for consideration  of Feraheme infusion. She was advised to call the clinic immediately if she has any concerning symptoms in the interval. All questions were answered. The patient knows to call the clinic with any problems, questions or concerns. We can certainly see the patient much sooner if necessary.

## 2012-10-31 ENCOUNTER — Ambulatory Visit: Payer: Self-pay | Admitting: Internal Medicine

## 2012-12-06 ENCOUNTER — Other Ambulatory Visit: Payer: Self-pay | Admitting: Physician Assistant

## 2012-12-06 ENCOUNTER — Other Ambulatory Visit (HOSPITAL_COMMUNITY)
Admission: RE | Admit: 2012-12-06 | Discharge: 2012-12-06 | Disposition: A | Discharge status: Home / Self Care | Payer: BC Managed Care – PPO | Source: Ambulatory Visit | Attending: Family Medicine | Admitting: Family Medicine

## 2012-12-06 DIAGNOSIS — Z124 Encounter for screening for malignant neoplasm of cervix: Secondary | ICD-10-CM | POA: Insufficient documentation

## 2013-01-25 ENCOUNTER — Inpatient Hospital Stay (HOSPITAL_COMMUNITY)
Admission: EM | Admit: 2013-01-25 | Discharge: 2013-01-27 | DRG: 541 | Disposition: A | Discharge status: Home / Self Care | Payer: BC Managed Care – PPO | Attending: Internal Medicine | Admitting: Internal Medicine

## 2013-01-25 ENCOUNTER — Encounter (HOSPITAL_COMMUNITY): Payer: Self-pay | Admitting: *Deleted

## 2013-01-25 DIAGNOSIS — D539 Nutritional anemia, unspecified: Secondary | ICD-10-CM | POA: Diagnosis present

## 2013-01-25 DIAGNOSIS — J189 Pneumonia, unspecified organism: Principal | ICD-10-CM

## 2013-01-25 DIAGNOSIS — R6510 Systemic inflammatory response syndrome (SIRS) of non-infectious origin without acute organ dysfunction: Secondary | ICD-10-CM

## 2013-01-25 DIAGNOSIS — R000 Tachycardia, unspecified: Secondary | ICD-10-CM

## 2013-01-25 DIAGNOSIS — Z79899 Other long term (current) drug therapy: Secondary | ICD-10-CM

## 2013-01-25 DIAGNOSIS — D649 Anemia, unspecified: Secondary | ICD-10-CM

## 2013-01-25 DIAGNOSIS — Z888 Allergy status to other drugs, medicaments and biological substances status: Secondary | ICD-10-CM

## 2013-01-25 DIAGNOSIS — D696 Thrombocytopenia, unspecified: Secondary | ICD-10-CM | POA: Diagnosis present

## 2013-01-25 DIAGNOSIS — Z681 Body mass index (BMI) 19 or less, adult: Secondary | ICD-10-CM

## 2013-01-25 DIAGNOSIS — E871 Hypo-osmolality and hyponatremia: Secondary | ICD-10-CM

## 2013-01-25 DIAGNOSIS — K5190 Ulcerative colitis, unspecified, without complications: Secondary | ICD-10-CM | POA: Diagnosis present

## 2013-01-25 DIAGNOSIS — E43 Unspecified severe protein-calorie malnutrition: Secondary | ICD-10-CM

## 2013-01-25 HISTORY — DX: Pneumonia, unspecified organism: J18.9

## 2013-01-25 HISTORY — DX: Encounter for other specified aftercare: Z51.89

## 2013-01-25 HISTORY — DX: Anemia, unspecified: D64.9

## 2013-01-25 LAB — CREATININE, SERUM
Creatinine, Ser: 1.02 mg/dL (ref 0.50–1.10)
GFR calc Af Amer: 71 mL/min — ABNORMAL LOW (ref >90)
GFR calc non Af Amer: 62 mL/min — ABNORMAL LOW (ref >90)

## 2013-01-25 LAB — CBC
HCT: 30.2 % — ABNORMAL LOW (ref 36.0–46.0)
MCHC: 33.4 g/dL (ref 30.0–36.0)
RDW: 12 % (ref 11.5–15.5)
WBC: 10.2 10*3/uL (ref 4.0–10.5)

## 2013-01-25 MED ORDER — LEVOFLOXACIN IN D5W 750 MG/150ML IV SOLN
750.0000 mg | INTRAVENOUS | Status: DC
  Filled 2013-01-25: qty 150

## 2013-01-25 MED ORDER — TAB-A-VITE/IRON PO TABS
1.0000 | ORAL_TABLET | Freq: Every day | ORAL | Status: DC
  Administered 2013-01-25 – 2013-01-27 (×3): 1 via ORAL
  Filled 2013-01-25 (×4): qty 1

## 2013-01-25 MED ORDER — SODIUM CHLORIDE 0.9 % IV SOLN: INTRAVENOUS | Status: AC

## 2013-01-25 MED ORDER — SODIUM CHLORIDE 0.9 % IV SOLN
INTRAVENOUS | Status: DC
  Administered 2013-01-25 – 2013-01-27 (×4): via INTRAVENOUS

## 2013-01-25 MED ORDER — LEVOFLOXACIN IN D5W 750 MG/150ML IV SOLN
750.0000 mg | INTRAVENOUS | Status: DC
  Administered 2013-01-25: 750 mg via INTRAVENOUS
  Filled 2013-01-25: qty 150

## 2013-01-25 MED ORDER — VENLAFAXINE HCL ER 37.5 MG PO CP24
37.5000 mg | ORAL_CAPSULE | Freq: Every day | ORAL | Status: DC
  Administered 2013-01-25 – 2013-01-27 (×3): 37.5 mg via ORAL
  Filled 2013-01-25 (×3): qty 1

## 2013-01-25 MED ORDER — ENOXAPARIN SODIUM 40 MG/0.4ML ~~LOC~~ SOLN
40.0000 mg | SUBCUTANEOUS | Status: DC
  Administered 2013-01-25: 40 mg via SUBCUTANEOUS
  Filled 2013-01-25 (×2): qty 0.4

## 2013-01-25 MED ORDER — FOLIC ACID 1 MG PO TABS
1.0000 mg | ORAL_TABLET | Freq: Every day | ORAL | Status: DC
  Administered 2013-01-25 – 2013-01-27 (×3): 1 mg via ORAL
  Filled 2013-01-25 (×3): qty 1

## 2013-01-25 MED ORDER — GUAIFENESIN-DM 100-10 MG/5ML PO SYRP
5.0000 mL | ORAL_SOLUTION | ORAL | Status: DC | PRN
  Administered 2013-01-26 – 2013-01-27 (×3): 5 mL via ORAL
  Filled 2013-01-25 (×3): qty 5

## 2013-01-25 MED ORDER — SODIUM CHLORIDE 0.9 % IV SOLN
Freq: Once | INTRAVENOUS | Status: AC
  Administered 2013-01-25: 16:00:00 via INTRAVENOUS

## 2013-01-25 MED ORDER — INTEGRA PLUS PO CAPS: 1.0000 | ORAL_CAPSULE | Freq: Every day | ORAL | Status: DC

## 2013-01-25 MED ORDER — SULFASALAZINE 500 MG PO TABS
2500.0000 mg | ORAL_TABLET | Freq: Every day | ORAL | Status: DC
  Administered 2013-01-26 – 2013-01-27 (×2): 2500 mg via ORAL
  Filled 2013-01-25 (×2): qty 5

## 2013-01-25 NOTE — ED Provider Notes (Signed)
History     CSN: [id number]  Arrival date & time 01/25/13  1348   First MD Initiated Contact with Patient 01/25/13 1453      Chief Complaint  Patient presents with  . Pneumonia    (Consider location/radiation/quality/duration/timing/severity/associated sxs/prior treatment) HPI Comments: 52 year old female with a history of ulcerative colitis presents with a complaint of cough and shortness of breath. She states that she started having nasal congestion and a sore throat several days ago, over the last 24 hours she developed significant shortness of breath, increased work of breathing and pain in her left side of her chest with coughing. This has been persistent, nothing makes it better or worse, gradually worsening and not associated with fevers. She was seen at her primary doctor's office, labs were drawn showing no leukocytosis, normal renal function and only mild hyponatremia and an EKG which showed mild sinus tachycardia. She was also had a chest x-ray which was read as left lower lobe pneumonia. She was sent to the hospital for admission to the hospital.  Patient is a 52 y.o. female presenting with pneumonia. The history is provided by the patient, a relative and medical records.  Pneumonia    Past Medical History  Diagnosis Date  . Ulcerative colitis 1981    HAS CURRENT NONBLOODY DIARRHEA X3 WKS  . IBS (irritable bowel syndrome)   . Ulcerative colitis   . Blood transfusion, without reported diagnosis     Past Surgical History  Procedure Date  . Breast cyst excision   . Ganglion cyst excision 01/31/2009    left wrist  . Dorsal compartment release 01/31/2009    release 1st dorsal compartment  . Mass excision 03/02/2012    Procedure: EXCISION MASS;  Surgeon: Lodema Pilot, DO;  Location: [facility];  Service: General;  Laterality: Right;  excison of right chest wall mass 3x3     Family History  Problem Relation Age of Onset  . Cancer Mother     History   Substance Use Topics  . Smoking status: Never Smoker   . Smokeless tobacco: Not on file  . Alcohol Use: 1.2 oz/week    2 Glasses of wine per week     Comment: SOCIAL    OB History    Grav Para Term Preterm Abortions TAB SAB Ect Mult Living                  Review of Systems  All other systems reviewed and are negative.    Allergies  Metronidazole  Home Medications   Current Outpatient Rx  Name  Route  Sig  Dispense  Refill  . INTEGRA PLUS PO CAPS   Oral   Take 1 capsule by mouth daily.   30 capsule   2   . FOLIC ACID 1 MG PO TABS   Oral   Take 1 mg by mouth daily.          . SULFASALAZINE 500 MG PO TABS   Oral   Take 2,500 mg by mouth Daily.          . VENLAFAXINE HCL ER 37.5 MG PO CP24   Oral   Take 37.5 mg by mouth daily.           BP 107/70  Pulse 122  Temp 98.3 F (36.8 C) (Oral)  Resp 18  SpO2 97%  Physical Exam  Nursing note and vitals reviewed. Constitutional: She appears well-developed and well-nourished. No distress.  HENT:  Head: Normocephalic  and atraumatic.  Mouth/Throat: Oropharynx is clear and moist. No oropharyngeal exudate.  Eyes: Conjunctivae normal and EOM are normal. Pupils are equal, round, and reactive to light. Right eye exhibits no discharge. Left eye exhibits no discharge. No scleral icterus.  Neck: Normal range of motion. Neck supple. No JVD present. No thyromegaly present.  Cardiovascular: Regular rhythm, normal heart sounds and intact distal pulses.  Exam reveals no gallop and no friction rub.   No murmur heard.      Tachycardia present  Pulmonary/Chest: Effort normal and breath sounds normal. No respiratory distress. She has no wheezes. She has no rales.  Abdominal: Soft. Bowel sounds are normal. She exhibits no distension and no mass. There is no tenderness.  Musculoskeletal: Normal range of motion. She exhibits no edema and no tenderness.  Lymphadenopathy:    She has no cervical adenopathy.  Neurological: She  is alert. Coordination normal.  Skin: Skin is warm and dry. No rash noted. No erythema.  Psychiatric: She has a normal mood and affect. Her behavior is normal.    ED Course  Procedures (including critical care time)  Labs Reviewed - No data to display No results found.   1. CAP (community acquired pneumonia)   2. Hyponatremia       MDM  The patient has a normal mental status, oxygen levels are 97%, pulse is 120 and she is afebrile. I have reviewed all of the paperwork which the patient has brought with her, white blood cell count 10,700, hemoglobin 11.5, sodium of 132, sinus tachycardia and chest x-ray with left lower lobe pneumonia. At this time the patient appears uncomfortable but does not appear toxic. She will need IV placed, fluids, oxygen, and a box admission to the hospital at her doctor's request.  Discussed with hospitalist who will admit. Antibiotics given in the office prior to arrival.      Vida Roller, MD 01/25/13 1549

## 2013-01-25 NOTE — Progress Notes (Signed)
NURSING PROGRESS NOTE  SCHYLER BUTIKOFER [id number] Admission Data: 01/25/2013 5:58 PM Attending Provider: Kathlen Mody, MD WUJ:[id number],GNFAO August Saucer, MD Code Status: full  Yesenia Crawford is a 52 y.o. female patient admitted from ED  No acute distress noted.  No c/o shortness of breath, no c/o chest pain.  Cardiac tele # [phone], in place, cardiac monitor yields:sinus tachycardia.  Blood pressure 157/85, pulse 108, temperature 98.1 F (36.7 C), temperature source Oral, resp. rate 20, SpO2 99.00%.   IV Fluids:  IV in place, occlusive dsg intact without redness, IV cath wrist left, condition patent and no redness normal saline.   Allergies:  Metronidazole  Past Medical History:   has a past medical history of Ulcerative colitis (1981); IBS (irritable bowel syndrome); Ulcerative colitis; Blood transfusion, without reported diagnosis; Pneumonia; and Anemia.  Past Surgical History:   has past surgical history that includes Breast cyst excision; Ganglion cyst excision (01/31/2009); Dorsal compartment release (01/31/2009); and Mass excision (03/02/2012).  Social History:   reports that she has never smoked. She does not have any smokeless tobacco history on file. She reports that she drinks about 1.2 ounces of alcohol per week. She reports that she does not use illicit drugs.  Skin: warm dry and intact  Orientation to room, and floor completed with information packet given to patient/family. Admission INP armband ID verified with patient/family, and in place.   SR up x 2, fall assessment complete, with patient and family able to verbalize understanding of risk associated with falls, and verbalized understanding to call for assistance before getting out of bed.   Call light within reach. Patient able to voice and demonstrate understanding of unit orientation instructions.   Will cont to eval and treat per MD orders.  Rosalie Doctor, RN

## 2013-01-25 NOTE — H&P (Addendum)
[facility] History and Physical  Yesenia Crawford ZOX:[id number] DOB: 03/11/1961 DOA: 01/25/2013   PCP: Benita Stabile, MD / Dr Kirby Funk.   Chief Complaint: cough cold since one week.   HPI: Yesenia Crawford is a 52 y.o. female wit h prior h/o Ulcerative colitis in remission on sulfasalazine came in for cough cold, shortness of breath since one week. She went to see Dr Valentina Lucks today at office, got CXR  And lab work done. She was found to have left lower lobe pneumonia. She was sent to ED for admission to the hospital. On arrival she was found to be tachycardic. She denies chest pain, nausea or vomiting. No syncopal episodes. She is being admitted for CAP. Will request Dr Valentina Lucks to take over the care of the patient tomorrow.   Review of Systems: The patient denies anorexia, fever, weight loss,, vision loss, decreased hearing, hoarseness, chest pain, syncope,  peripheral edema, balance deficits, hemoptysis, abdominal pain, melena, hematochezia, severe indigestion/heartburn, hematuria, incontinence, genital sores, muscle weakness, suspicious skin lesions, transient blindness, difficulty walking, depression, unusual weight change, abnormal bleeding, enlarged lymph nodes, angioedema, and breast masses.    Past Medical History  Diagnosis Date  . Ulcerative colitis 1981    HAS CURRENT NONBLOODY DIARRHEA X3 WKS  . IBS (irritable bowel syndrome)   . Ulcerative colitis   . Blood transfusion, without reported diagnosis    Past Surgical History  Procedure Date  . Breast cyst excision   . Ganglion cyst excision 01/31/2009    left wrist  . Dorsal compartment release 01/31/2009    release 1st dorsal compartment  . Mass excision 03/02/2012    Procedure: EXCISION MASS;  Surgeon: Lodema Pilot, DO;  Location: [facility];  Service: General;  Laterality: Right;  excison of right chest wall mass 3x3    Social History:  reports that she has never smoked. She does not have any  smokeless tobacco history on file. She reports that she drinks about 1.2 ounces of alcohol per week. She reports that she does not use illicit drugs.  where does patient live--home, with son Allergies  Allergen Reactions  . Metronidazole Hives    Family History  Problem Relation Age of Onset  . Cancer Mother     Prior to Admission medications   Medication Sig Start Date End Date Taking? Authorizing Provider  FeFum-FePoly-FA-B Cmp-C-Biot (INTEGRA PLUS) CAPS Take 1 capsule by mouth daily. 04/27/12  Yes Si Gaul, MD  folic acid (FOLVITE) 1 MG tablet Take 1 mg by mouth daily.  05/21/11  Yes Historical Provider, MD  sulfaSALAzine (AZULFIDINE) 500 MG tablet Take 2,500 mg by mouth Daily.  07/12/12  Yes Historical Provider, MD  venlafaxine XR (EFFEXOR-XR) 37.5 MG 24 hr capsule Take 37.5 mg by mouth daily.   Yes Historical Provider, MD   Physical Exam: Filed Vitals:   01/25/13 1350 01/25/13 1551  BP: 107/70 114/64  Pulse: 122 116  Temp: 98.3 F (36.8 C)   TempSrc: Oral   Resp: 18   SpO2: 97% 94%    Constitutional: Vital signs reviewed.  Patient is a well-developed and well-nourished  in no acute distress and cooperative with exam. Alert and oriented x3.  Head: Normocephalic and atraumatic Mouth: no erythema or exudates, dryMM Eyes: PERRL, EOMI, conjunctivae normal, No scleral icterus.  Neck: Supple, Trachea midline normal ROM, No JVD, mass, thyromegaly, or carotid bruit present.  Cardiovascular: RRR, S1 normal, S2 normal, no MRG, pulses symmetric and intact bilaterally Pulmonary/Chest: CTAB, no  wheezes, rales, or rhonchi Abdominal: Soft. Non-tender, non-distended, bowel sounds are normal, no masses, organomegaly, or guarding present.  Musculoskeletal: No joint deformities, erythema, or stiffness, ROM full and no nontender Neurological: A&O x3, Strength is normal and symmetric bilaterally, cranial nerve II-XII are grossly intact, no focal motor deficit, sensory intact to light touch  bilaterally.  Skin: Warm, dry and intact. No rash, cyanosis, or clubbing.  Psychiatric: Normal mood and affect. speech and behavior is normal. Labs on Admission:  Basic Metabolic Panel: No results found for this basename: NA:5,K:5,CL:5,CO2:5,GLUCOSE:5,BUN:5,CREATININE:5,CALCIUM:5,MG:5,PHOS:5 in the last 168 hours Liver Function Tests: No results found for this basename: AST:5,ALT:5,ALKPHOS:5,BILITOT:5,PROT:5,ALBUMIN:5 in the last 168 hours No results found for this basename: LIPASE:5,AMYLASE:5 in the last 168 hours No results found for this basename: AMMONIA:5 in the last 168 hours CBC: No results found for this basename: WBC:5,NEUTROABS:5,HGB:5,HCT:5,MCV:5,PLT:5 in the last 168 hours Cardiac Enzymes: No results found for this basename: CKTOTAL:5,CKMB:5,CKMBINDEX:5,TROPONINI:5 in the last 168 hours  BNP (last 3 results) No results found for this basename: PROBNP:3 in the last 8760 hours CBG: No results found for this basename: GLUCAP:5 in the last 168 hours  Radiological Exams on Admission: No results found.  EKG: sinus tachy  Assessment/Plan Active Problems  1. Community acquired Pneumonia: - admit to telemetry overnight as she is tachycardic with HR in 120's.  - start her on IV levaquin/ nasal oxygen as needed/ urine strepto and legionella ordered.  - she is afebrile now.   2. Ulcerative Colitis; in remission.  - resume sulfasalazine and folic acid   3. Tachycardia possibly from the pneumonia  4. Anemia: macrocytic. Will obtain anemia panel  5. Hyponatremia: possibly from dehydration. IV hydration with normal saline.   DVT prophylaxis.   Code Status: full code Family Communication: noen atbedside Disposition Plan: 1-2 days.   Time spent: 45 min  Aarron Wierzbicki [facility] Pager [phone] If 7PM-7AM, please contact night-coverage [email or web address] Password [facility] 01/25/2013, 4:12 PM

## 2013-01-25 NOTE — ED Notes (Signed)
Pt was sent here by her pcp for pnx to LL lobe.  Labs and x-ray at pcp, paperwork in hand.  Pt is tachycardic in 120's with sats of 93 % on RA.  Was placed on O2 at pcp's office. No hx of copd and pt is not a smoker.

## 2013-01-25 NOTE — ED Notes (Signed)
Admitting MD at bedside.

## 2013-01-25 NOTE — Progress Notes (Signed)
ANTIBIOTIC CONSULT NOTE - INITIAL  Pharmacy Consult for levaquin Indication: pneumonia  Allergies  Allergen Reactions  . Metronidazole Hives    Patient Measurements:   Adjusted Body Weight:   Vital Signs: Temp: 98.1 F (36.7 C) (02/06 1756) Temp src: Oral (02/06 1350) BP: 157/85 mmHg (02/06 1756) Pulse Rate: 108  (02/06 1756) Intake/Output from previous day:   Intake/Output from this shift:    Labs:  [facility] 01/25/13 1812  WBC 10.2  HGB 10.1*  PLT PENDING  LABCREA --  CREATININE 1.02   The CrCl is unknown because both a height and weight (above a minimum accepted value) are required for this calculation. No results found for this basename: VANCOTROUGH:2,VANCOPEAK:2,VANCORANDOM:2,GENTTROUGH:2,GENTPEAK:2,GENTRANDOM:2,TOBRATROUGH:2,TOBRAPEAK:2,TOBRARND:2,AMIKACINPEAK:2,AMIKACINTROU:2,AMIKACIN:2, in the last 72 hours   Microbiology: No results found for this or any previous visit (from the past 720 hour(s)).  Medical History: Past Medical History  Diagnosis Date  . Ulcerative colitis 1981    HAS CURRENT NONBLOODY DIARRHEA X3 WKS  . IBS (irritable bowel syndrome)   . Ulcerative colitis   . Blood transfusion, without reported diagnosis   . Pneumonia   . Anemia     Medications:  Scheduled:    . [COMPLETED] sodium chloride   Intravenous Once  . sodium chloride   Intravenous STAT  . enoxaparin (LOVENOX) injection  40 mg Subcutaneous Q24H  . folic acid  1 mg Oral Daily  . levofloxacin (LEVAQUIN) IV  750 mg Intravenous Q24H  . multivitamins with iron  1 tablet Oral Daily  . sulfaSALAzine  2,500 mg Oral Daily  . venlafaxine XR  37.5 mg Oral Daily  . [DISCONTINUED] INTEGRA PLUS  1 capsule Oral Daily   Infusions:    . sodium chloride     Assessment: 52 yo female with pneumonia will be started on levaquin IV.  SCr 1.02 and CrCl ~47.8.  Wt 47.5 kg  Goal of Therapy:    Plan:  1) Change levaquin to 750mg  iv q48h x 5 doses. 2) Monitor renal  function.   Toiya Morrish, Tsz-Yin 01/25/2013,7:02 PM

## 2013-01-26 ENCOUNTER — Other Ambulatory Visit: Payer: BC Managed Care – PPO

## 2013-01-26 ENCOUNTER — Telehealth: Payer: Self-pay | Admitting: *Deleted

## 2013-01-26 ENCOUNTER — Other Ambulatory Visit: Payer: BC Managed Care – PPO | Admitting: Lab

## 2013-01-26 DIAGNOSIS — E871 Hypo-osmolality and hyponatremia: Secondary | ICD-10-CM

## 2013-01-26 DIAGNOSIS — E43 Unspecified severe protein-calorie malnutrition: Secondary | ICD-10-CM | POA: Diagnosis present

## 2013-01-26 DIAGNOSIS — R6510 Systemic inflammatory response syndrome (SIRS) of non-infectious origin without acute organ dysfunction: Secondary | ICD-10-CM | POA: Diagnosis present

## 2013-01-26 LAB — CBC
MCHC: 34.4 g/dL (ref 30.0–36.0)
Platelets: 97 10*3/uL — ABNORMAL LOW (ref 150–400)
RDW: 12.1 % (ref 11.5–15.5)
WBC: 10 10*3/uL (ref 4.0–10.5)

## 2013-01-26 LAB — BASIC METABOLIC PANEL
BUN: 15 mg/dL (ref 6–23)
Calcium: 8.3 mg/dL — ABNORMAL LOW (ref 8.4–10.5)
Chloride: 100 mEq/L (ref 96–112)
Creatinine, Ser: 0.82 mg/dL (ref 0.50–1.10)
GFR calc Af Amer: >90 mL/min (ref >90)
GFR calc non Af Amer: 80 mL/min — ABNORMAL LOW (ref >90)

## 2013-01-26 LAB — HIV ANTIBODY (ROUTINE TESTING W REFLEX): HIV: NONREACTIVE

## 2013-01-26 LAB — SAVE SMEAR

## 2013-01-26 MED ORDER — HYDROCOD POLST-CHLORPHEN POLST 10-8 MG/5ML PO LQCR
5.0000 mL | Freq: Every evening | ORAL | Status: DC | PRN
  Administered 2013-01-26: 5 mL via ORAL
  Filled 2013-01-26: qty 5

## 2013-01-26 MED ORDER — IPRATROPIUM BROMIDE 0.02 % IN SOLN: 0.5000 mg | Freq: Four times a day (QID) | RESPIRATORY_TRACT | Status: DC | PRN

## 2013-01-26 MED ORDER — LEVOFLOXACIN IN D5W 750 MG/150ML IV SOLN
750.0000 mg | INTRAVENOUS | Status: DC
  Administered 2013-01-26: 750 mg via INTRAVENOUS
  Filled 2013-01-26 (×2): qty 150

## 2013-01-26 MED ORDER — ALBUTEROL SULFATE (5 MG/ML) 0.5% IN NEBU: 2.5000 mg | INHALATION_SOLUTION | Freq: Four times a day (QID) | RESPIRATORY_TRACT | Status: DC | PRN

## 2013-01-26 MED ORDER — PANTOPRAZOLE SODIUM 40 MG PO TBEC
40.0000 mg | DELAYED_RELEASE_TABLET | Freq: Every day | ORAL | Status: DC
  Administered 2013-01-26 – 2013-01-27 (×2): 40 mg via ORAL
  Filled 2013-01-26 (×2): qty 1

## 2013-01-26 MED ORDER — IBUPROFEN 600 MG PO TABS
600.0000 mg | ORAL_TABLET | Freq: Four times a day (QID) | ORAL | Status: DC | PRN
  Filled 2013-01-26: qty 1

## 2013-01-26 MED ORDER — ENSURE COMPLETE PO LIQD: 237.0000 mL | ORAL | Status: DC | PRN

## 2013-01-26 NOTE — Progress Notes (Signed)
ANTIBIOTIC CONSULT NOTE - FOLLOW UP  Pharmacy Consult for levaquin Indication: pneumonia  Allergies  Allergen Reactions  . Metronidazole Hives    Patient Measurements: Height: 5\' 5"  (165.1 cm) Weight: 112 lb 10.5 oz (51.1 kg) IBW/kg (Calculated) : 57  Adjusted Body Weight:   Vital Signs: Temp: 98.3 F (36.8 C) (02/07 0355) Temp src: Oral (02/07 0355) BP: 106/63 mmHg (02/07 0355) Pulse Rate: 100  (02/07 0355) Intake/Output from previous day: 02/06 0701 - 02/07 0700 In: 851.7 [I.V.:851.7] Out: -  Intake/Output from this shift: Total I/O In: 120 [P.O.:120] Out: 550 [Urine:550]  Labs:  [facility] 01/26/13 0625 01/25/13 1812  WBC 10.0 10.2  HGB 9.0* 10.1*  PLT 97* PLATELET CLUMPS NOTED ON SMEAR, UNABLE TO ESTIMATE  LABCREA -- --  CREATININE 0.82 1.02   Estimated Creatinine Clearance: 64 ml/min (by C-G formula based on Cr of 0.82). No results found for this basename: VANCOTROUGH:2,VANCOPEAK:2,VANCORANDOM:2,GENTTROUGH:2,GENTPEAK:2,GENTRANDOM:2,TOBRATROUGH:2,TOBRAPEAK:2,TOBRARND:2,AMIKACINPEAK:2,AMIKACINTROU:2,AMIKACIN:2, in the last 72 hours   Microbiology: No results found for this or any previous visit (from the past 720 hour(s)).  Anti-infectives     Start     Dose/Rate Route Frequency Ordered Stop   01/26/13 2000   levofloxacin (LEVAQUIN) IVPB 750 mg        750 mg 100 mL/hr over 90 Minutes Intravenous Every 24 hours 01/26/13 1337 01/30/13 1959   01/25/13 2000   levofloxacin (LEVAQUIN) IVPB 750 mg  Status:  Discontinued        750 mg 100 mL/hr over 90 Minutes Intravenous Every 48 hours 01/25/13 1903 01/26/13 1337   01/25/13 1800   levofloxacin (LEVAQUIN) IVPB 750 mg  Status:  Discontinued        750 mg 100 mL/hr over 90 Minutes Intravenous Every 24 hours 01/25/13 1757 01/25/13 1903          Assessment: 52 yo female with pneumonia is currently on day 2 of levaquin therapy.  SCr better now and it's down to 0.82 (CrCl ~64).  Goal of Therapy:    Plan:  1)  Change levaquin to 750mg  iv q24h for 4 more days.  Will sign off.  Uno Esau, Tsz-Yin 01/26/2013,1:42 PM

## 2013-01-26 NOTE — Progress Notes (Signed)
[facility] PROGRESS NOTE  Yesenia Crawford ZOX:[id number] DOB: 1961-03-13 DOA: 01/25/2013 PCP: Benita Stabile, MD  Assessment/Plan:  Community acquired Pneumonia:  Patient with low grade fever, non productive cough and tachycardia overnight. Blood cultures Pending Strep Pneumo Negative HIV NR On IV Levaquin, Nebs, IVF, Supportive care.  Ulcerative Colitis; in remission.  resume sulfasalazine and folic acid   Tachycardia  possibly from the pneumonia   Macrocytic anemia with thrombocytopenia Anemia Panel Pending Obtain peripheral smear  She has an excellent PCP and is stable for further work up as an outpatient pending our findings.  Hyponatremia: possibly from dehydration. IV hydration with normal saline.    Code Status: full Family Communication:  Disposition Plan: to home when able.      Antibiotics:  levaquin  HPI/Subjective: Yesenia Crawford is a 52 y.o. female wit h prior h/o Ulcerative colitis in remission on sulfasalazine came in for cough cold, shortness of breath since one week. She went to see Dr Valentina Lucks today at office, got CXR And lab work done. She was found to have left lower lobe pneumonia. She was sent to ED for admission to the hospital. On arrival she was found to be tachycardic. She denies chest pain, nausea or vomiting. No syncopal episodes. She is being admitted for CAP  2/7 complains of chest pain with deep inspiration  Objective: Filed Vitals:   01/25/13 1715 01/25/13 1756 01/25/13 2154 01/26/13 0355  BP: 107/69 157/85 102/64 106/63  Pulse: 117 108 109 100  Temp:  98.1 F (36.7 C) 99.3 F (37.4 C) 98.3 F (36.8 C)  TempSrc:   Oral Oral  Resp: 26 20 20 20   Height:  5\' 5"  (1.651 m)  5\' 5"  (1.651 m)  Weight:  49.3 kg (108 lb 11 oz)  51.1 kg (112 lb 10.5 oz)  SpO2: 91% 99% 91% 92%    Intake/Output Summary (Last 24 hours) at 01/26/13 1119 Last data filed at 01/26/13 1007  Gross per 24 hour  Intake 971.67 ml  Output    550 ml   Net 421.67 ml   Filed Weights   01/25/13 1756 01/26/13 0355  Weight: 49.3 kg (108 lb 11 oz) 51.1 kg (112 lb 10.5 oz)    Exam:   General:  Well-developed thin Caucasian female, lying comfortably in bed  Cardiovascular: Tachycardia, no murmurs rubs or gallops, sinus tach on telemetry  Respiratory: Slight wheeze in posterior auscultation. Nonproductive cough, no accessory muscle use  Abdomen: soft, nt, nd, +BS, no Masses  Extremities:  No C/C/Edema  Data Reviewed: Basic Metabolic Panel:  Lab 01/26/13 8119 01/25/13 1812  NA 133* --  K 3.5 --  CL 100 --  CO2 25 --  GLUCOSE 89 --  BUN 15 --  CREATININE 0.82 1.02  CALCIUM 8.3* --  MG -- --  PHOS -- --   CBC:  Lab 01/26/13 0625 01/25/13 1812  WBC 10.0 10.2  NEUTROABS -- --  HGB 9.0* 10.1*  HCT 26.2* 30.2*  MCV 103.1* 103.8*  PLT 97* PLATELET CLUMPS NOTED ON SMEAR, UNABLE TO ESTIMATE     Studies: No results found.  Scheduled Meds:   . sodium chloride   Intravenous STAT  . enoxaparin (LOVENOX) injection  40 mg Subcutaneous Q24H  . folic acid  1 mg Oral Daily  . levofloxacin (LEVAQUIN) IV  750 mg Intravenous Q48H  . multivitamins with iron  1 tablet Oral Daily  . pantoprazole  40 mg Oral Daily  . sulfaSALAzine  2,500 mg Oral  Daily  . venlafaxine XR  37.5 mg Oral Daily   Continuous Infusions:   . sodium chloride 100 mL/hr at 01/26/13 0354    Active Problems:  Anemia  Ulcerative colitis  CAP (community acquired pneumonia)  Tachycardia    Time spent: 30 min    Conley Canal  [facility] Pager [phone]. If 8PM-8AM, please contact night-coverage at [email or web address], password [facility] 01/26/2013, 11:19 AM  LOS: 1 day

## 2013-01-26 NOTE — Progress Notes (Addendum)
-   Agree with plan as above have reviewed the data. - Peripheral smear, she's on no medications except for heparin that would cause thrombocytopenia. About 3 months ago her platelet count was 150. I will go ahead and DC the heparin started her on SCDs. The HIT panel. She has not gotten heparin in the last 3 months. Could be secondary to infectious etiology she was in SIRS of admission. - CBC in am.

## 2013-01-26 NOTE — Telephone Encounter (Signed)
error 

## 2013-01-26 NOTE — Progress Notes (Signed)
INITIAL NUTRITION ASSESSMENT  DOCUMENTATION CODES Per approved criteria  -Underweight   INTERVENTION: 1. Ensure Complete po PRN, each supplement provides 350 kcal and 13 grams of protein. 2. RD will continue to follow    NUTRITION DIAGNOSIS: Inadequate oral intake related to poor appetite as evidenced by only tolerating broth at this time.   Goal: Po intake to meet >/=90% estimated nutrition needs  Monitor:  Po intake, weight trends, I/o's, labs  Reason for Assessment: Health history  52 y.o. female  Admitting Dx: CAP  ASSESSMENT: Pt with hx of UC admitted with CAP. Upon weight hx review pt with recent weight trending up, remains underweight for given height. Pt at increased risk for malnutrition given current low body weight and likely some level of malabsorption with UC and chronic diarrhea.   Pt reports that she usually weighs about 105 lbs, does not do any nutrition supplements at home. Willing to have Ensure PRN. States her appetite is poor right now, only able to tolerate broth right now.   Height: Ht Readings from Last 1 Encounters:  01/26/13 5\' 5"  (1.651 m)    Weight: Wt Readings from Last 1 Encounters:  01/26/13 112 lb 10.5 oz (51.1 kg)    Ideal Body Weight: 125 lbs   % Ideal Body Weight: 90%  Wt Readings from Last 10 Encounters:  01/26/13 112 lb 10.5 oz (51.1 kg)  07/31/12 104 lb 11.2 oz (47.492 kg)  04/27/12 102 lb 1.6 oz (46.312 kg)  04/11/12 99 lb 4.8 oz (45.042 kg)  03/30/12 98 lb (44.453 kg)  03/28/12 98 lb 9.6 oz (44.725 kg)  02/29/12 104 lb (47.174 kg)  02/29/12 104 lb (47.174 kg)  03/01/12 103 lb (46.72 kg)  08/03/11 110 lb 12.8 oz (50.259 kg)    Usual Body Weight: 105 lbs per pt report   % Usual Body Weight: 106%  BMI:  Body mass index is 18.75 kg/(m^2). Underweight   Estimated Nutritional Needs: Kcal: 1400-1600 Protein: 50-60 gm  Fluid: >/=1.5 L/day  Skin: intact   Diet Order: General  EDUCATION NEEDS: -No education needs  identified at this time   Intake/Output Summary (Last 24 hours) at 01/26/13 1121 Last data filed at 01/26/13 1007  Gross per 24 hour  Intake 971.67 ml  Output    550 ml  Net 421.67 ml    Last BM: PTA    Labs:   Lab 01/26/13 0625 01/25/13 1812  NA 133* --  K 3.5 --  CL 100 --  CO2 25 --  BUN 15 --  CREATININE 0.82 1.02  CALCIUM 8.3* --  MG -- --  PHOS -- --  GLUCOSE 89 --    CBG (last 3)  No results found for this basename: GLUCAP:3 in the last 72 hours  Scheduled Meds:   . sodium chloride   Intravenous STAT  . enoxaparin (LOVENOX) injection  40 mg Subcutaneous Q24H  . folic acid  1 mg Oral Daily  . levofloxacin (LEVAQUIN) IV  750 mg Intravenous Q48H  . multivitamins with iron  1 tablet Oral Daily  . pantoprazole  40 mg Oral Daily  . sulfaSALAzine  2,500 mg Oral Daily  . venlafaxine XR  37.5 mg Oral Daily    Continuous Infusions:   . sodium chloride 100 mL/hr at 01/26/13 0354    Past Medical History  Diagnosis Date  . Ulcerative colitis 1981    HAS CURRENT NONBLOODY DIARRHEA X3 WKS  . IBS (irritable bowel syndrome)   . Ulcerative colitis   .  Blood transfusion, without reported diagnosis   . Pneumonia   . Anemia     Past Surgical History  Procedure Date  . Breast cyst excision   . Ganglion cyst excision 01/31/2009    left wrist  . Dorsal compartment release 01/31/2009    release 1st dorsal compartment  . Mass excision 03/02/2012    Procedure: EXCISION MASS;  Surgeon: Lodema Pilot, DO;  Location: [facility];  Service: General;  Laterality: Right;  excison of right chest wall mass 3x3     Clarene Duke RD, LDN Pager [phone] After Hours pager [phone]

## 2013-01-27 LAB — CBC
HCT: 25.3 % — ABNORMAL LOW (ref 36.0–46.0)
HCT: 26.5 % — ABNORMAL LOW (ref 36.0–46.0)
Hemoglobin: 8.6 g/dL — ABNORMAL LOW (ref 12.0–15.0)
MCH: 35 pg — ABNORMAL HIGH (ref 26.0–34.0)
MCH: 35.5 pg — ABNORMAL HIGH (ref 26.0–34.0)
MCHC: 34 g/dL (ref 30.0–36.0)
MCV: 102.3 fL — ABNORMAL HIGH (ref 78.0–100.0)
Platelets: 110 10*3/uL — ABNORMAL LOW (ref 150–400)
RBC: 2.46 MIL/uL — ABNORMAL LOW (ref 3.87–5.11)
RDW: 12 % (ref 11.5–15.5)
WBC: 6.4 10*3/uL (ref 4.0–10.5)

## 2013-01-27 LAB — LEGIONELLA ANTIGEN, URINE

## 2013-01-27 MED ORDER — PANTOPRAZOLE SODIUM 40 MG PO TBEC: 40.0000 mg | DELAYED_RELEASE_TABLET | Freq: Every day | ORAL | Status: DC

## 2013-01-27 MED ORDER — IBUPROFEN 600 MG PO TABS: 600.0000 mg | ORAL_TABLET | Freq: Four times a day (QID) | ORAL | Status: DC | PRN

## 2013-01-27 MED ORDER — HYDROCOD POLST-CHLORPHEN POLST 10-8 MG/5ML PO LQCR: 5.0000 mL | Freq: Every evening | ORAL | Status: DC | PRN

## 2013-01-27 MED ORDER — GUAIFENESIN-DM 100-10 MG/5ML PO SYRP: 5.0000 mL | ORAL_SOLUTION | ORAL | Status: DC | PRN

## 2013-01-27 MED ORDER — LEVOFLOXACIN 750 MG PO TABS: 750.0000 mg | ORAL_TABLET | Freq: Every day | ORAL | Status: DC

## 2013-01-27 NOTE — Discharge Summary (Signed)
Physician Discharge Summary  Yesenia Crawford JWJ:[id number] DOB: 1961-07-19 DOA: 01/25/2013  PCP: Benita Stabile, MD  Admit date: 01/25/2013 Discharge date: 01/27/2013  Time spent: 30 minutes  Recommendations for Outpatient Follow-up:  1. Hospital follow with PCP in 2 weeks.  Discharge Diagnoses:  Active Problems:   Anemia   Ulcerative colitis   CAP (community acquired pneumonia)   Tachycardia   SIRS (systemic inflammatory response syndrome)   Severe protein-calorie malnutrition   Discharge Condition: stable  Diet recommendation: Heart healthy diet.  Filed Weights   01/25/13 1756 01/26/13 0355  Weight: 49.3 kg (108 lb 11 oz) 51.1 kg (112 lb 10.5 oz)    History of present illness:  52 y.o. female wit h prior h/o Ulcerative colitis in remission on sulfasalazine came in for cough cold, shortness of breath since one week. She went to see Dr Valentina Lucks today at office, got CXR And lab work done. She was found to have left lower lobe pneumonia. She was sent to ED for admission to the hospital. On arrival she was found to be tachycardic. She denies chest pain, nausea or vomiting. No syncopal episodes. She is being admitted for CAP. Will request Dr Valentina Lucks to take over the care of the patient tomorrow.    Hospital Course:  Community acquired Pneumonia:  - Patient with low grade fever, non productive cough and tachycardia. - Strep Pneumo Negative  - HIV NR  - On IV Levaquin, Nebs, IVF. When saturations improved, tachycardia resolved and respiration improved. regimen change to Levaquin which will continue for 5 more days.  Ulcerative Colitis; in remission.  resume sulfasalazine and folic acid.  Tachycardia  possibly from the pneumonia   Macrocytic anemia with thrombocytopenia  Anemia Panel Pending  Obtain peripheral smear  She has an excellent PCP and is stable for further work up as an outpatient pending our findings.   Hyponatremia:  possibly from dehydration. IV hydration with  normal saline.    Procedures:  None  Consultations:  None   Discharge Exam: Filed Vitals:   01/26/13 1400 01/26/13 2037 01/26/13 2100 01/27/13 0527  BP: 104/67 110/73 109/68 103/61  Pulse: 103 105 104 103  Temp: 98.9 F (37.2 C) 99.3 F (37.4 C) 99 F (37.2 C) 97.3 F (36.3 C)  TempSrc: Oral Oral Oral Oral  Resp: 20 18 18 18   Height:      Weight:      SpO2: 94% 93% 94% 94%    General: A&O x3 Cardiovascular: RRR Respiratory: good air movement CTA B/L  Discharge Instructions      Discharge Orders   Future Orders Complete By Expires     Diet - low sodium heart healthy  As directed     Increase activity slowly  As directed         Medication List    TAKE these medications       folic acid 1 MG tablet  Commonly known as:  FOLVITE  Take 1 mg by mouth daily.     guaiFENesin-dextromethorphan 100-10 MG/5ML syrup  Commonly known as:  ROBITUSSIN DM  Take 5 mLs by mouth every 4 (four) hours as needed for cough.     ibuprofen 600 MG tablet  Commonly known as:  ADVIL,MOTRIN  Take 1 tablet (600 mg total) by mouth every 6 (six) hours as needed.     INTEGRA PLUS Caps  Take 1 capsule by mouth daily.     levofloxacin 750 MG tablet  Commonly known as:  LEVAQUIN  Take 1 tablet (750 mg total) by mouth daily.     pantoprazole 40 MG tablet  Commonly known as:  PROTONIX  Take 1 tablet (40 mg total) by mouth daily.     sulfaSALAzine 500 MG tablet  Commonly known as:  AZULFIDINE  Take 2,500 mg by mouth Daily.     venlafaxine XR 37.5 MG 24 hr capsule  Commonly known as:  EFFEXOR-XR  Take 37.5 mg by mouth daily.       Follow-up Information   Follow up with Benita Stabile, MD In 2 weeks.   Contact information:   [facility] [address] [place] [address] [phone]        The results of significant diagnostics from this hospitalization (including imaging, microbiology, ancillary and laboratory) are  listed below for reference.    Significant Diagnostic Studies: No results found.  Microbiology: No results found for this or any previous visit (from the past 240 hour(s)).   Labs: Basic Metabolic Panel:  Recent Labs Lab 01/25/13 1812 01/26/13 0625  NA  --  133*  K  --  3.5  CL  --  100  CO2  --  25  GLUCOSE  --  89  BUN  --  15  CREATININE 1.02 0.82  CALCIUM  --  8.3*   Liver Function Tests: No results found for this basename: AST, ALT, ALKPHOS, BILITOT, PROT, ALBUMIN,  in the last 168 hours No results found for this basename: LIPASE, AMYLASE,  in the last 168 hours No results found for this basename: AMMONIA,  in the last 168 hours CBC:  Recent Labs Lab 01/25/13 1812 01/26/13 0625 01/27/13 0634  WBC 10.2 10.0 6.0  HGB 10.1* 9.0* 8.6*  HCT 30.2* 26.2* 25.3*  MCV 103.8* 103.1* 102.8*  PLT PLATELET CLUMPS NOTED ON SMEAR, UNABLE TO ESTIMATE 97* 100*   Cardiac Enzymes: No results found for this basename: CKTOTAL, CKMB, CKMBINDEX, TROPONINI,  in the last 168 hours BNP: BNP (last 3 results) No results found for this basename: PROBNP,  in the last 8760 hours CBG: No results found for this basename: GLUCAP,  in the last 168 hours   Signed:  Marinda Elk  [facility] 01/27/2013, 8:47 AM

## 2013-01-27 NOTE — Progress Notes (Signed)
Pt prepared for discharge, instructions given including copy and Rx given.  IV d/c without problem.  Alert, oriented, skin intact, via w/c escorted out to meet her sister for discharge home.  Bonney Leitz RN

## 2013-01-29 LAB — HEPARIN INDUCED THROMBOCYTOPENIA PNL
Heparin Induced Plt Ab: NEGATIVE
Patient O.D.: 0.071
UFH Low Dose 0.5 IU/mL: 0 % Release

## 2013-01-30 ENCOUNTER — Other Ambulatory Visit: Payer: BC Managed Care – PPO | Admitting: Lab

## 2013-01-30 ENCOUNTER — Ambulatory Visit: Payer: BC Managed Care – PPO | Admitting: Internal Medicine

## 2013-02-02 LAB — CULTURE, BLOOD (ROUTINE X 2): Culture: NO GROWTH

## 2013-02-03 ENCOUNTER — Other Ambulatory Visit: Payer: Self-pay

## 2013-06-29 ENCOUNTER — Other Ambulatory Visit: Payer: Self-pay

## 2013-06-29 DIAGNOSIS — Z1231 Encounter for screening mammogram for malignant neoplasm of breast: Secondary | ICD-10-CM

## 2013-07-03 IMAGING — CT CT ABD-PELV W/ CM
2 of 5 series · 14 of 32 positions shown, 19 images · IV contrast (READICAT/WATER & [ID] OMNI 300)
Comparison: None.

CLINICAL DATA: Left-sided chest pain.  Decreased hemoglobin.
Diarrhea.  Nausea and vomiting.  Low grade fever.  Recent greater
than 10 x 1 loss.

CT ABDOMEN AND PELVIS WITH CONTRAST
TECHNIQUE: Multidetector CT imaging of the abdomen and pelvis was
performed following the standard protocol during bolus
administration of intravenous contrast.
Contrast: 100mL OMNIPAQUE IOHEXOL 300 MG/ML IJ SOLN

[Series 2: abd/pelvis with · axial · 0.66mm/px · z∈[-364,-44]mm · 7 of 86 slices shown, 12 images]
[im 11/86  soft-tissue]
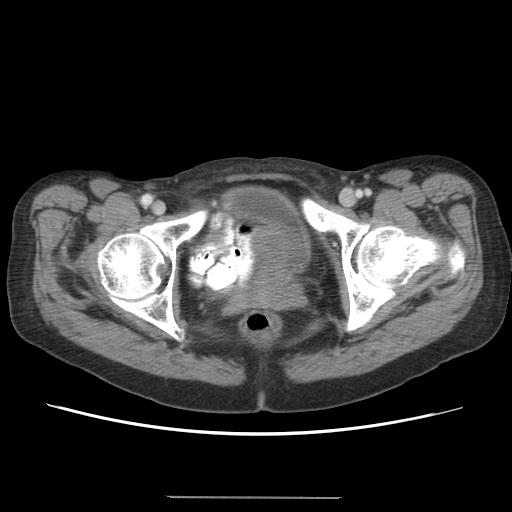
[im 11/86  bone]
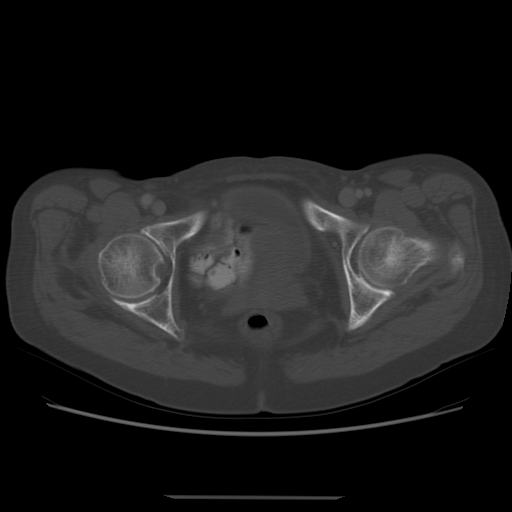
[im 22/86  soft-tissue]
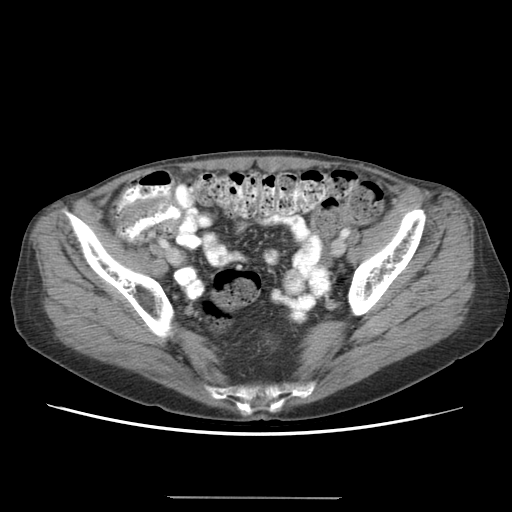
[im 32/86  soft-tissue]
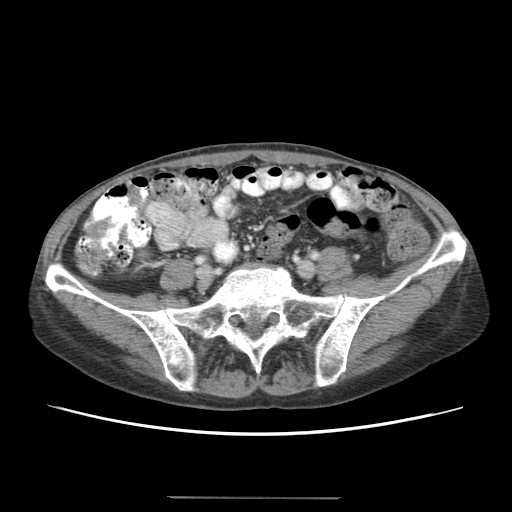
[im 43/86  soft-tissue]
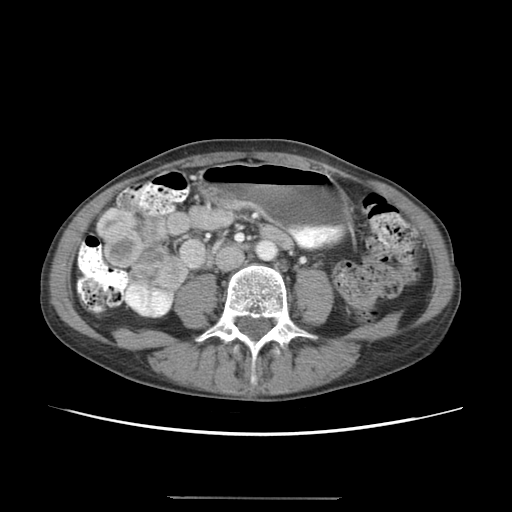
[im 43/86  lung]
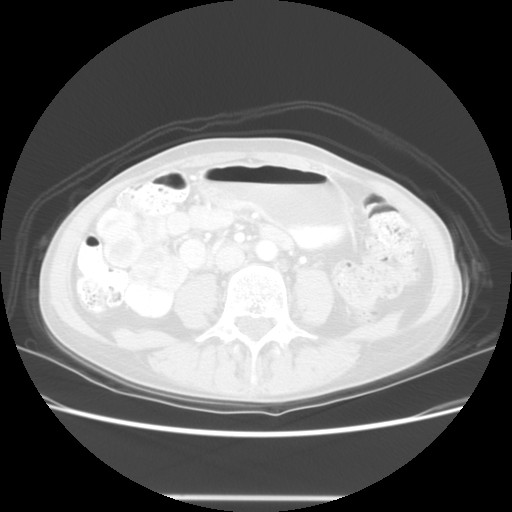
[im 54/86  soft-tissue]
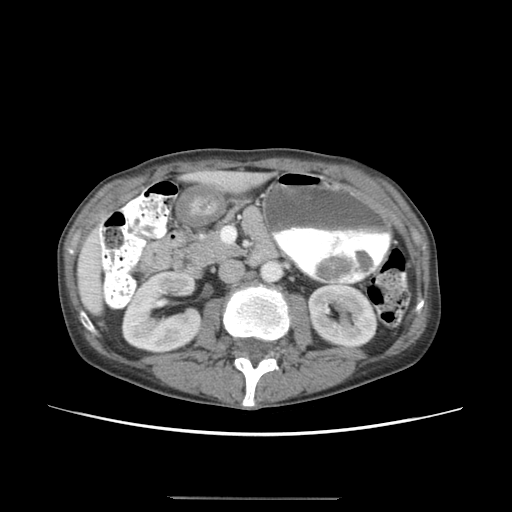
[im 54/86  lung]
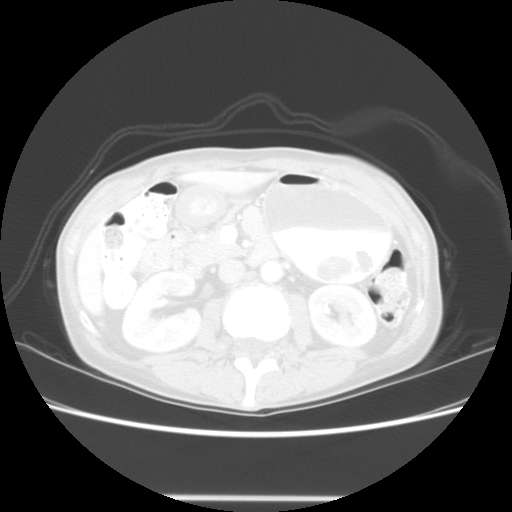
[im 64/86  soft-tissue]
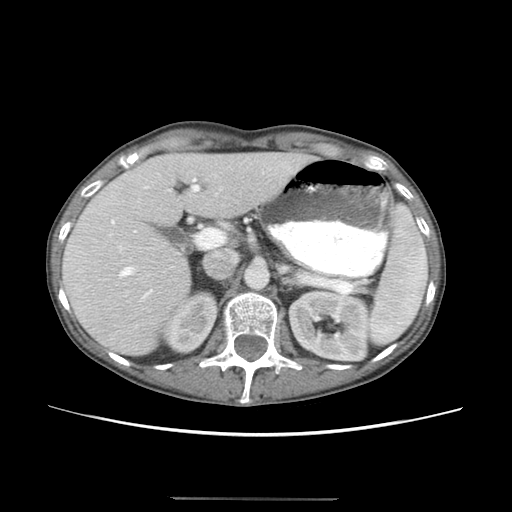
[im 64/86  lung]
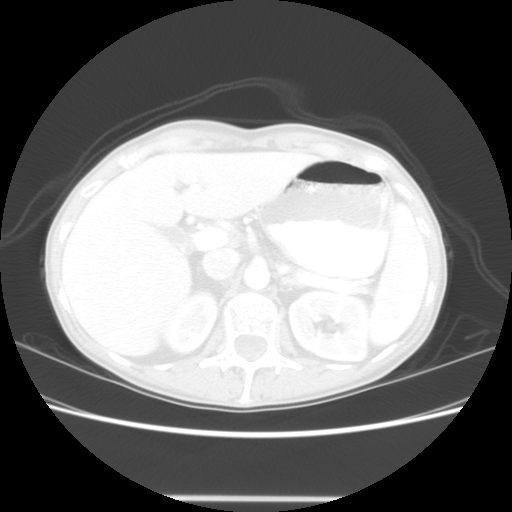
[im 75/86  soft-tissue]
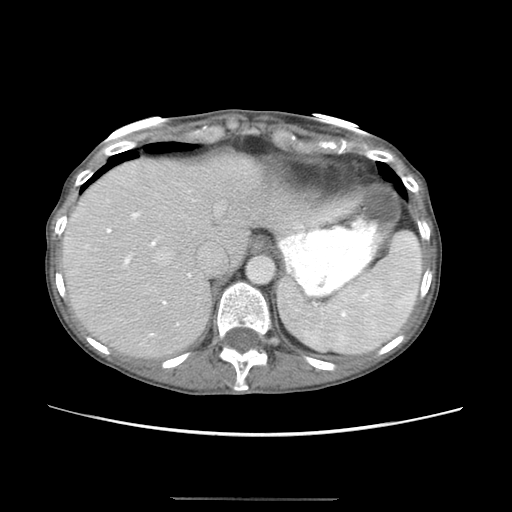
[im 75/86  lung]
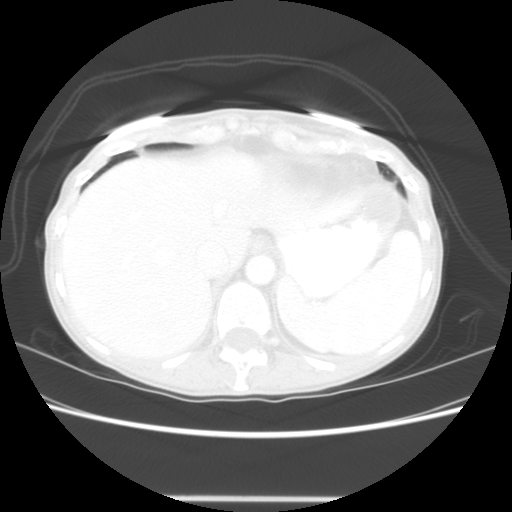

[Series 400: sag · sagittal · 0.88mm/px · 7 of 112 slices shown]
[im 12/112  soft-tissue]
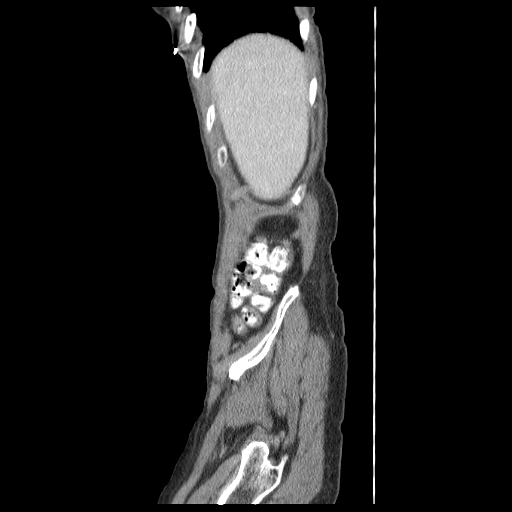
[im 23/112  soft-tissue]
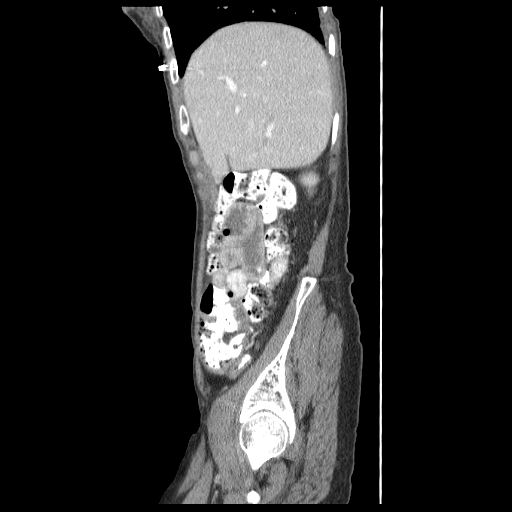
[im 34/112  soft-tissue]
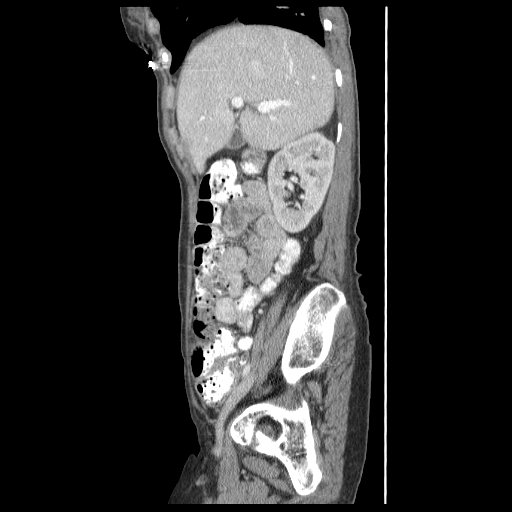
[im 45/112  soft-tissue]
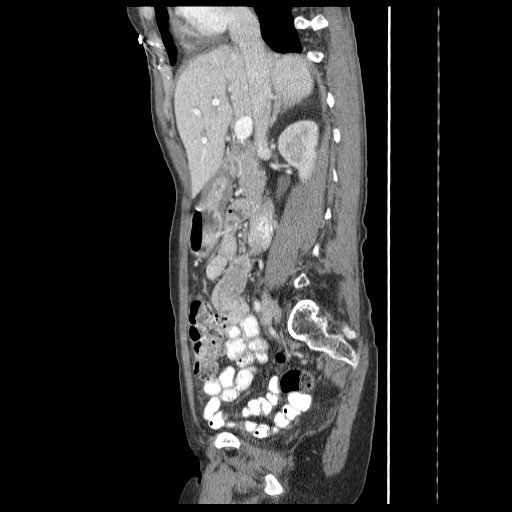
[im 67/112  soft-tissue]
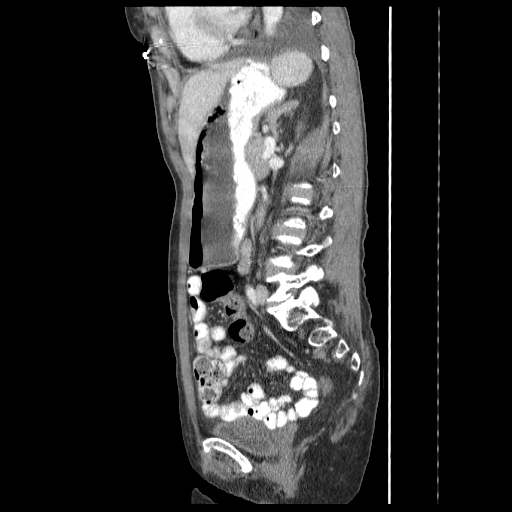
[im 78/112  soft-tissue]
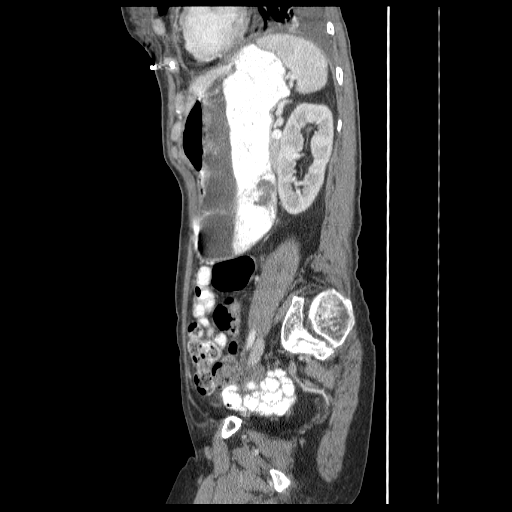
[im 89/112  soft-tissue]
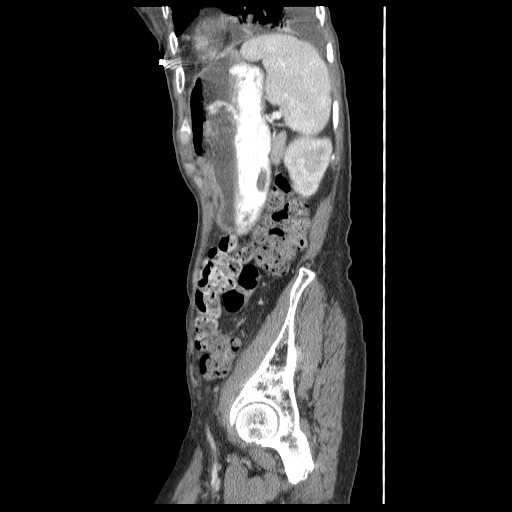

[14 of 32 positions shown; findings below may reference images not displayed]

FINDINGS: Images through the lung bases show a small left-sided
pleural effusion and mild left basilar atelectasis.  There is also
a tiny pericardial effusion versus pericardial thickening.

The abdominal parenchymal organs are normal in appearance.
Gallbladder is unremarkable.  No evidence of hydronephrosis.  No
soft tissue masses or lymphadenopathy are identified within the
abdomen or pelvis.

Uterus and adnexa are unremarkable.  Congenital malrotation of
small bowel is seen with the jejunum located in the right abdomen
rod within the left.  The cecum appears normal location.  No
evidence of bowel wall thickening or dilatation.  No evidence of
inflammatory process or abnormal fluid collections.
IMPRESSION: 1.  No acute findings within the abdomen or pelvis.
2.  Congenital malrotation of small bowel noted, with jejunum
located in the right abdomen.
3.  Small left pleural effusion and tiny pericardial effusion
versus pericardial thickening.  These findings are uncertain
etiology and clinical significance; consider chest radiograph for
further evaluation.

## 2013-07-20 ENCOUNTER — Ambulatory Visit
Admission: RE | Admit: 2013-07-20 | Discharge: 2013-07-20 | Disposition: A | Discharge status: Home / Self Care | Payer: BC Managed Care – PPO | Source: Ambulatory Visit

## 2013-07-20 DIAGNOSIS — Z1231 Encounter for screening mammogram for malignant neoplasm of breast: Secondary | ICD-10-CM

## 2013-10-25 ENCOUNTER — Other Ambulatory Visit: Payer: Self-pay

## 2014-02-02 ENCOUNTER — Ambulatory Visit (HOSPITAL_COMMUNITY)
Admit: 2014-02-02 | Discharge: 2014-02-02 | Disposition: A | Discharge status: Home / Self Care | Payer: BC Managed Care – PPO | Attending: Emergency Medicine | Admitting: Emergency Medicine

## 2014-02-02 ENCOUNTER — Encounter (HOSPITAL_COMMUNITY): Payer: Self-pay | Admitting: Emergency Medicine

## 2014-02-02 ENCOUNTER — Emergency Department (HOSPITAL_COMMUNITY)
Admission: EM | Admit: 2014-02-02 | Discharge: 2014-02-02 | Disposition: A | Discharge status: Home / Self Care | Payer: BC Managed Care – PPO | Source: Home / Self Care | Attending: Emergency Medicine | Admitting: Emergency Medicine

## 2014-02-02 DIAGNOSIS — M79609 Pain in unspecified limb: Secondary | ICD-10-CM

## 2014-02-02 DIAGNOSIS — M7989 Other specified soft tissue disorders: Secondary | ICD-10-CM

## 2014-02-02 DIAGNOSIS — S86819A Strain of other muscle(s) and tendon(s) at lower leg level, unspecified leg, initial encounter: Secondary | ICD-10-CM

## 2014-02-02 DIAGNOSIS — S86119A Strain of other muscle(s) and tendon(s) of posterior muscle group at lower leg level, unspecified leg, initial encounter: Secondary | ICD-10-CM

## 2014-02-02 DIAGNOSIS — S838X9A Sprain of other specified parts of unspecified knee, initial encounter: Secondary | ICD-10-CM

## 2014-02-02 LAB — D-DIMER, QUANTITATIVE (NOT AT ARMC): D DIMER QUANT: 2.72 ug{FEU}/mL — AB (ref 0.00–0.48)

## 2014-02-02 MED ORDER — HYDROCODONE-ACETAMINOPHEN 5-325 MG PO TABS: ORAL_TABLET | ORAL | Status: DC

## 2014-02-02 NOTE — ED Notes (Signed)
Vascular tech paged

## 2014-02-02 NOTE — ED Notes (Signed)
Reports having pain and swelling in left calf since Monday.  Denies hx of DVT and injury.  States pain is worse at night unable to put pressure on leg.  Pt has tried elevation, ice and aspirin with no relief.

## 2014-02-02 NOTE — Progress Notes (Signed)
VASCULAR LAB PRELIMINARY  PRELIMINARY  PRELIMINARY  PRELIMINARY  Left lower extremity venous Doppler completed.    Preliminary report:  There is no obvious evidence of DVT or SVT noted in the left lower extremity. There is some "muscle disturbance" noted in the mid calf, etiology unknown.  Javanna Patin, RVT 02/02/2014, 3:16 PM

## 2014-02-02 NOTE — Discharge Instructions (Signed)

## 2014-02-02 NOTE — ED Notes (Signed)
Vascular tech Candice returned page.  She stated to send patient to ED and they will page her upon patient's arrival

## 2014-02-02 NOTE — ED Provider Notes (Signed)
Chief Complaint   Chief Complaint  Patient presents with  . Leg Pain    History of Present Illness   Yesenia Crawford is a 53 year old female who has had a five-day history of pain and swelling of the left calf area. She denies any injury to this. It's tender to touch and also hurts to walk. She has pain if she dorsiflexes or plantar flex her ankle. There is no numbness, tingling, or weakness. No bruising. She denies any recent prolonged car or plane trips. No prolonged immobilization. No prior history of DVT or current use of estrogen medications. She denies any chest pain but has felt mildly short of breath for the past week. She's had a dry cough. Last week she had some pain in her right upper quadrant of her fatigue inspiration but now that is gone away. She has a history of ulcer colitis and is on sulfasalazine for that.  Review of Systems   Other than as noted above, the patient denies any of the following symptoms: Systemic:  No fever, chills, sweats, weight gain or loss. Respiratory:  No coughing, wheezing, or shortness of breath. Cardiac:  No chest pain, tightness, pressure or syncope. GI:  No abdominal pain, swelling, distension, nausea, or vomiting. GU:  No dysuria, frequency, or hematuria. Ext:  No joint pain or muscle pain. Skin:  No rash or itching. Neuro:  No paresthesias or muscle weakness.  [place]   Past medical history, family history, social history, meds, and allergies were reviewed.  She is allergic to Flagyl. She takes venlafaxine for menopausal symptoms.  Physical Examination     Vital signs:  BP 132/75  Pulse 70  Temp(Src) 98.2 F (36.8 C) (Oral)  Resp 18  SpO2 100% Gen:  Alert, oriented, in no distress. Neck:  No tenderness, adenopathy, or JVD. Lungs:  Breath sounds clear and equal bilaterally.  No rales, rhonchi or wheezes. Heart:  Regular rhythm, no gallops or murmers. Abdomen:  Soft, nontender, no organomegaly or mass. Ext:  There is slight swelling  of the left calf medially and pain to palpation. There is no bruising, no distended vessels, no palpable cord. Homans sign is positive. Pedal pulses were full. She has brisk capillary refill in all her toes. Neuro:  Alert and oriented times 3.  No muscle weakness.  Sensation intact to light touch. Skin:  Warm and dry.  No rash or skin lesions.  Labs   Results for orders placed during the hospital encounter of 02/02/14  D-DIMER, QUANTITATIVE      Result Value Ref Range   D-Dimer, Quant 2.72 (*) 0.00 - 0.48 ug/mL-FEU     Radiology   A venous Doppler was read as negative for DVT. There was some abnormality in appearance of the gastrocnemius muscle.  I reviewed the images independently and personally and concur with the radiologist's findings.  [facility]   Leg was wrapped with an Ace wrap.  Assessment   The encounter diagnosis was Gastrocnemius muscle tear.  No evidence of DVT.  Plan   1.  Meds:  The following meds were prescribed:   Discharge Medication List as of 02/02/2014  3:40 PM    START taking these medications   Details  HYDROcodone-acetaminophen (NORCO/VICODIN) 5-325 MG per tablet 1 to 2 tabs every 4 to 6 hours as needed for pain., Print        2.  Patient Education/Counseling:  The patient was given appropriate handouts, self care instructions, and instructed in  symptomatic relief.  Patient instructed in rest, ice, elevation, and compression.  3.  Follow up:  The patient was told to follow up here if no better in 3 to 4 days, or sooner if becoming worse in any way, and given some red flag symptoms such as worsening swelling, chest pain, shortness of breath or fever which would prompt immediate return.       Harden Mo, MD 02/02/14 [phone]

## 2014-06-10 ENCOUNTER — Telehealth: Payer: Self-pay | Admitting: *Deleted

## 2014-06-10 NOTE — Telephone Encounter (Signed)
Pt called stating she is having an iron panel done at her PCP and she wanted to see if it was okay to have them fax it over to Dr Vista Mink to review.  Informed her that she can have them fax it over and we will see if Dr Vista Mink thinks she needs to f/u with him.  SLJ

## 2014-06-20 NOTE — Telephone Encounter (Signed)
Received referral packet from Dr Estella Husk office wanting pt to be seen by Dr Vista Mink.  Packet with pt records given to Dr Vista Mink to review.  SLJ

## 2014-06-24 ENCOUNTER — Other Ambulatory Visit: Payer: Self-pay | Admitting: Medical Oncology

## 2014-06-24 ENCOUNTER — Telehealth: Payer: Self-pay | Admitting: Medical Oncology

## 2014-06-24 DIAGNOSIS — D649 Anemia, unspecified: Secondary | ICD-10-CM

## 2014-06-24 NOTE — Telephone Encounter (Signed)
Pt notified that someone from our office will be calling her to schedule appt with mohamed.

## 2014-06-26 ENCOUNTER — Telehealth: Payer: Self-pay | Admitting: Internal Medicine

## 2014-06-26 NOTE — Telephone Encounter (Signed)
, °

## 2014-07-16 ENCOUNTER — Other Ambulatory Visit: Payer: BC Managed Care – PPO

## 2014-07-16 ENCOUNTER — Other Ambulatory Visit (HOSPITAL_BASED_OUTPATIENT_CLINIC_OR_DEPARTMENT_OTHER): Payer: BC Managed Care – PPO

## 2014-07-16 ENCOUNTER — Ambulatory Visit (HOSPITAL_BASED_OUTPATIENT_CLINIC_OR_DEPARTMENT_OTHER): Payer: BC Managed Care – PPO | Admitting: Internal Medicine

## 2014-07-16 ENCOUNTER — Encounter: Payer: Self-pay | Admitting: Internal Medicine

## 2014-07-16 ENCOUNTER — Telehealth: Payer: Self-pay | Admitting: Internal Medicine

## 2014-07-16 VITALS — BP 154/83 | HR 75 | Temp 98.3°F | Resp 18 | Ht 65.0 in | Wt 105.1 lb

## 2014-07-16 DIAGNOSIS — D508 Other iron deficiency anemias: Secondary | ICD-10-CM

## 2014-07-16 DIAGNOSIS — D638 Anemia in other chronic diseases classified elsewhere: Secondary | ICD-10-CM

## 2014-07-16 DIAGNOSIS — D539 Nutritional anemia, unspecified: Secondary | ICD-10-CM

## 2014-07-16 DIAGNOSIS — D649 Anemia, unspecified: Secondary | ICD-10-CM

## 2014-07-16 DIAGNOSIS — K5190 Ulcerative colitis, unspecified, without complications: Secondary | ICD-10-CM

## 2014-07-16 LAB — CBC WITH DIFFERENTIAL/PLATELET
BASO%: 0.3 % (ref 0.0–2.0)
Basophils Absolute: 0 10*3/uL (ref 0.0–0.1)
EOS%: 0 % (ref 0.0–7.0)
Eosinophils Absolute: 0 10*3/uL (ref 0.0–0.5)
HCT: 29.4 % — ABNORMAL LOW (ref 34.8–46.6)
HGB: 9.7 g/dL — ABNORMAL LOW (ref 11.6–15.9)
LYMPH%: 19.9 % (ref 14.0–49.7)
MCH: 34.1 pg — ABNORMAL HIGH (ref 25.1–34.0)
MCHC: 33 g/dL (ref 31.5–36.0)
MCV: 103.2 fL — AB (ref 79.5–101.0)
MONO#: 0.2 10*3/uL (ref 0.1–0.9)
MONO%: 6 % (ref 0.0–14.0)
NEUT#: 2.8 10*3/uL (ref 1.5–6.5)
NEUT%: 73.8 % (ref 38.4–76.8)
PLATELETS: 167 10*3/uL (ref 145–400)
RBC: 2.85 10*6/uL — ABNORMAL LOW (ref 3.70–5.45)
RDW: 11.9 % (ref 11.2–14.5)
WBC: 3.8 10*3/uL — AB (ref 3.9–10.3)
lymph#: 0.8 10*3/uL — ABNORMAL LOW (ref 0.9–3.3)

## 2014-07-16 LAB — COMPREHENSIVE METABOLIC PANEL (CC13)
ALT: 15 U/L (ref 0–55)
AST: 19 U/L (ref 5–34)
Albumin: 4.1 g/dL (ref 3.5–5.0)
Alkaline Phosphatase: 72 U/L (ref 40–150)
Anion Gap: 9 mEq/L (ref 3–11)
BILIRUBIN TOTAL: 0.61 mg/dL (ref 0.20–1.20)
BUN: 19 mg/dL (ref 7.0–26.0)
CO2: 26 mEq/L (ref 22–29)
Calcium: 9.6 mg/dL (ref 8.4–10.4)
Chloride: 105 mEq/L (ref 98–109)
Creatinine: 1.5 mg/dL — ABNORMAL HIGH (ref 0.6–1.1)
Glucose: 90 mg/dl (ref 70–140)
POTASSIUM: 3.9 meq/L (ref 3.5–5.1)
Sodium: 140 mEq/L (ref 136–145)
Total Protein: 7.1 g/dL (ref 6.4–8.3)

## 2014-07-16 NOTE — Telephone Encounter (Signed)
Pt confirmed labs/ov per 07/28 POF, gave pt AVS....KJ °

## 2014-07-16 NOTE — Progress Notes (Signed)
[facility] [phone]   Fax:(336) [place], [address] [place] [address]  DIAGNOSIS: Anemia of chronic disease plus/minus iron deficiency.   PRIOR THERAPY: Integra plus 1 capsule by mouth daily for one month.   CURRENT THERAPY: None.  INTERVAL HISTORY: Yesenia Crawford 53 y.o. female returns to the clinic today for followup visit based on her request for evaluation of her anemia. She was recently seen by her primary care physician and blood work at that time showed hemoglobin was down to 8.4 g/dL. She has been off the iron tablets for more than a year. The patient continues to complain of increasing fatigue and weakness. She denied having any chest pain, shortness of breath, cough or hemoptysis. She has no bleeding issues or dizzy spells. She has repeat CBC earlier today and she is here for evaluation and discussion of her lab results.  MEDICAL HISTORY: Past Medical History  Diagnosis Date  . Ulcerative colitis 1981    HAS CURRENT NONBLOODY DIARRHEA X3 WKS  . IBS (irritable bowel syndrome)   . Ulcerative colitis   . Blood transfusion, without reported diagnosis   . Pneumonia   . Anemia     ALLERGIES:  is allergic to metronidazole.  MEDICATIONS:  Current Outpatient Prescriptions  Medication Sig Dispense Refill  . chlorpheniramine-HYDROcodone (TUSSIONEX) 10-8 MG/5ML LQCR Take 5 mLs by mouth at bedtime as needed.  115 mL  0  . FeFum-FePoly-FA-B Cmp-C-Biot (INTEGRA PLUS) CAPS Take 1 capsule by mouth daily.  30 capsule  2  . folic acid (FOLVITE) 1 MG tablet Take 1 mg by mouth daily.       Marland Kitchen guaiFENesin-dextromethorphan (ROBITUSSIN DM) 100-10 MG/5ML syrup Take 5 mLs by mouth every 4 (four) hours as needed for cough.  118 mL  0  . HYDROcodone-acetaminophen (NORCO/VICODIN) 5-325 MG per tablet 1 to 2 tabs every 4 to 6 hours as needed for pain.  20 tablet  0  . ibuprofen (ADVIL,MOTRIN) 600 MG  tablet Take 1 tablet (600 mg total) by mouth every 6 (six) hours as needed.  15 tablet  0  . levofloxacin (LEVAQUIN) 750 MG tablet Take 1 tablet (750 mg total) by mouth daily.  5 tablet  0  . pantoprazole (PROTONIX) 40 MG tablet Take 1 tablet (40 mg total) by mouth daily.  15 tablet  0  . sulfaSALAzine (AZULFIDINE) 500 MG tablet Take 2,500 mg by mouth Daily.       Marland Kitchen venlafaxine XR (EFFEXOR-XR) 37.5 MG 24 hr capsule Take 37.5 mg by mouth daily.       No current facility-administered medications for this visit.    SURGICAL HISTORY:  Past Surgical History  Procedure Laterality Date  . Breast cyst excision    . Ganglion cyst excision  01/31/2009    left wrist  . Dorsal compartment release  01/31/2009    release 1st dorsal compartment  . Mass excision  03/02/2012    Procedure: EXCISION MASS;  Surgeon: Madilyn Hook, DO;  Location: [facility];  Service: General;  Laterality: Right;  excison of right chest wall mass 3x3     REVIEW OF SYSTEMS:  A comprehensive review of systems was negative except for: Constitutional: positive for fatigue   PHYSICAL EXAMINATION: General appearance: alert, cooperative and no distress Head: Normocephalic, without obvious abnormality, atraumatic Lymph nodes: Cervical, supraclavicular, and axillary nodes normal. Resp: clear to auscultation bilaterally Cardio: regular rate  and rhythm, S1, S2 normal, no murmur, click, rub or gallop GI: soft, non-tender; bowel sounds normal; no masses,  no organomegaly Extremities: extremities normal, atraumatic, no cyanosis or edema  ECOG PERFORMANCE STATUS: 0 - Asymptomatic  Blood pressure 154/83, pulse 75, temperature 98.3 F (36.8 C), temperature source Oral, resp. rate 18, height 5\' 5"  (1.651 m), weight 105 lb 1.6 oz (47.673 kg).  LABORATORY DATA: Lab Results  Component Value Date   WBC 3.8* 07/16/2014   HGB 9.7* 07/16/2014   HCT 29.4* 07/16/2014   MCV 103.2* 07/16/2014   PLT 167 07/16/2014      Chemistry        Component Value Date/Time   NA 133* 01/26/2013 0625      Component Value Date/Time   CALCIUM 8.3* 01/26/2013 9741       RADIOGRAPHIC STUDIES: No results found.  ASSESSMENT AND PLAN: This is a very pleasant 53 years old white female with history of iron deficiency anemia and anemia of chronic disease secondary to history of ulcerative colitis.  Her CBC today showed persistent anemia but hemoglobin was up to 9.7 g/dL. I will repeat iron study and ferritin today. If she has significant iron deficiency, I would consider the patient for Feraheme infusion. I discussed the lab result with the patient today.  I recommended for her to resume her treatment with Integra plus 1 capsule by mouth daily. I would see her back for followup visit in 3 months with repeat anemia panel. She was advised to call the clinic immediately if she has any concerning symptoms in the interval. All questions were answered. The patient knows to call the clinic with any problems, questions or concerns. We can certainly see the patient much sooner if necessary.  Disclaimer: This note was dictated with voice recognition software. Similar sounding words can inadvertently be transcribed and may not be corrected upon review.

## 2014-07-17 LAB — IRON AND TIBC CHCC
%SAT: 32 % (ref 21–57)
Iron: 76 ug/dL (ref 41–142)
TIBC: 240 ug/dL (ref 236–444)
UIBC: 164 ug/dL (ref 120–384)

## 2014-07-17 LAB — FERRITIN CHCC: Ferritin: 258 ng/ml (ref 9–269)

## 2014-10-16 ENCOUNTER — Ambulatory Visit: Payer: BC Managed Care – PPO | Admitting: Internal Medicine

## 2014-10-16 ENCOUNTER — Other Ambulatory Visit: Payer: BC Managed Care – PPO

## 2015-06-24 ENCOUNTER — Other Ambulatory Visit: Payer: Self-pay | Admitting: Physician Assistant

## 2015-06-24 ENCOUNTER — Other Ambulatory Visit (HOSPITAL_COMMUNITY)
Admission: RE | Admit: 2015-06-24 | Discharge: 2015-06-24 | Disposition: A | Discharge status: Home / Self Care | Payer: BC Managed Care – PPO | Source: Ambulatory Visit | Attending: Family Medicine | Admitting: Family Medicine

## 2015-06-24 DIAGNOSIS — Z124 Encounter for screening for malignant neoplasm of cervix: Secondary | ICD-10-CM | POA: Insufficient documentation

## 2015-06-25 LAB — CYTOLOGY - PAP

## 2015-11-05 ENCOUNTER — Other Ambulatory Visit: Payer: Self-pay

## 2015-11-05 DIAGNOSIS — Z1231 Encounter for screening mammogram for malignant neoplasm of breast: Secondary | ICD-10-CM

## 2015-11-07 ENCOUNTER — Ambulatory Visit
Admission: RE | Admit: 2015-11-07 | Discharge: 2015-11-07 | Disposition: A | Discharge status: Home / Self Care | Payer: BC Managed Care – PPO | Source: Ambulatory Visit

## 2015-11-07 DIAGNOSIS — Z1231 Encounter for screening mammogram for malignant neoplasm of breast: Secondary | ICD-10-CM

## 2016-01-27 ENCOUNTER — Ambulatory Visit
Admission: RE | Admit: 2016-01-27 | Discharge: 2016-01-27 | Disposition: A | Discharge status: Home / Self Care | Payer: BC Managed Care – PPO | Source: Ambulatory Visit | Attending: Physician Assistant | Admitting: Physician Assistant

## 2016-01-27 ENCOUNTER — Other Ambulatory Visit: Payer: Self-pay | Admitting: Physician Assistant

## 2016-01-27 DIAGNOSIS — R0602 Shortness of breath: Secondary | ICD-10-CM

## 2016-01-27 DIAGNOSIS — R079 Chest pain, unspecified: Secondary | ICD-10-CM

## 2016-01-28 ENCOUNTER — Other Ambulatory Visit (HOSPITAL_COMMUNITY): Payer: Self-pay | Admitting: Physician Assistant

## 2016-01-28 DIAGNOSIS — R079 Chest pain, unspecified: Secondary | ICD-10-CM

## 2016-02-05 ENCOUNTER — Telehealth (HOSPITAL_COMMUNITY): Payer: Self-pay

## 2016-02-05 NOTE — Telephone Encounter (Signed)
Encounter complete. 

## 2016-02-10 ENCOUNTER — Ambulatory Visit (HOSPITAL_COMMUNITY)
Admission: RE | Admit: 2016-02-10 | Discharge: 2016-02-10 | Disposition: A | Discharge status: Home / Self Care | Payer: BC Managed Care – PPO | Source: Ambulatory Visit | Attending: Cardiovascular Disease | Admitting: Cardiovascular Disease

## 2016-02-10 DIAGNOSIS — R079 Chest pain, unspecified: Secondary | ICD-10-CM

## 2016-02-11 LAB — EXERCISE TOLERANCE TEST
CHL CUP MPHR: 164 {beats}/min
CHL CUP STRESS STAGE 1 DBP: 94 mmHg
CHL CUP STRESS STAGE 1 GRADE: 0 %
CHL CUP STRESS STAGE 3 HR: 75 {beats}/min
CHL CUP STRESS STAGE 4 HR: 103 {beats}/min
CHL CUP STRESS STAGE 4 SPEED: 1.7 mph
CHL CUP STRESS STAGE 5 GRADE: 12 %
CHL CUP STRESS STAGE 5 HR: 133 {beats}/min
CHL CUP STRESS STAGE 6 GRADE: 14 %
CHL CUP STRESS STAGE 6 HR: 150 {beats}/min
CHL CUP STRESS STAGE 7 HR: 134 {beats}/min
CHL CUP STRESS STAGE 7 SBP: 154 mmHg
CHL CUP STRESS STAGE 8 SPEED: 0 mph
CSEPHR: 92 %
CSEPPMHR: 91 %
Estimated workload: 10.1 METS
Exercise duration (min): 8 min
Peak HR: 150 {beats}/min
RPE: 15
Rest HR: 71 {beats}/min
Stage 1 HR: 75 {beats}/min
Stage 1 SBP: 144 mmHg
Stage 1 Speed: 0 mph
Stage 2 Grade: 0 %
Stage 2 HR: 75 {beats}/min
Stage 2 Speed: 1 mph
Stage 3 Grade: 0 %
Stage 3 Speed: 1 mph
Stage 4 DBP: 73 mmHg
Stage 4 Grade: 10 %
Stage 4 SBP: 142 mmHg
Stage 5 DBP: 87 mmHg
Stage 5 SBP: 152 mmHg
Stage 5 Speed: 2.5 mph
Stage 6 Speed: 3.4 mph
Stage 7 DBP: 92 mmHg
Stage 7 Grade: 0 %
Stage 7 Speed: 0 mph
Stage 8 DBP: 86 mmHg
Stage 8 Grade: 0 %
Stage 8 HR: 77 {beats}/min
Stage 8 SBP: 136 mmHg

## 2016-04-26 DIAGNOSIS — I776 Arteritis, unspecified: Secondary | ICD-10-CM | POA: Insufficient documentation

## 2016-04-26 DIAGNOSIS — N183 Chronic kidney disease, stage 3 unspecified: Secondary | ICD-10-CM | POA: Insufficient documentation

## 2016-04-26 DIAGNOSIS — I7782 Antineutrophilic cytoplasmic antibody (ANCA) vasculitis: Secondary | ICD-10-CM | POA: Insufficient documentation

## 2016-08-20 ENCOUNTER — Other Ambulatory Visit: Payer: Self-pay | Admitting: Gastroenterology

## 2016-09-21 ENCOUNTER — Encounter (HOSPITAL_COMMUNITY): Payer: Self-pay | Admitting: *Deleted

## 2016-09-23 ENCOUNTER — Encounter (HOSPITAL_COMMUNITY): Payer: Self-pay | Admitting: *Deleted

## 2016-09-27 ENCOUNTER — Ambulatory Visit (HOSPITAL_COMMUNITY): Payer: BC Managed Care – PPO | Admitting: Anesthesiology

## 2016-09-27 ENCOUNTER — Encounter (HOSPITAL_COMMUNITY): Payer: Self-pay

## 2016-09-27 ENCOUNTER — Ambulatory Visit (HOSPITAL_COMMUNITY)
Admission: RE | Admit: 2016-09-27 | Discharge: 2016-09-27 | Disposition: A | Discharge status: Home / Self Care | Payer: BC Managed Care – PPO | Source: Ambulatory Visit | Attending: Gastroenterology | Admitting: Gastroenterology

## 2016-09-27 ENCOUNTER — Encounter (HOSPITAL_COMMUNITY)
Admission: RE | Disposition: A | Discharge status: Home / Self Care | Payer: Self-pay | Source: Ambulatory Visit | Attending: Gastroenterology

## 2016-09-27 DIAGNOSIS — K5190 Ulcerative colitis, unspecified, without complications: Secondary | ICD-10-CM | POA: Insufficient documentation

## 2016-09-27 DIAGNOSIS — I7789 Other specified disorders of arteries and arterioles: Secondary | ICD-10-CM | POA: Diagnosis not present

## 2016-09-27 DIAGNOSIS — Z79899 Other long term (current) drug therapy: Secondary | ICD-10-CM | POA: Insufficient documentation

## 2016-09-27 DIAGNOSIS — Z1211 Encounter for screening for malignant neoplasm of colon: Secondary | ICD-10-CM | POA: Insufficient documentation

## 2016-09-27 HISTORY — DX: Arteritis, unspecified: I77.6

## 2016-09-27 HISTORY — DX: Antineutrophilic cytoplasmic antibody (ANCA) vasculitis: I77.82

## 2016-09-27 HISTORY — PX: COLONOSCOPY WITH PROPOFOL: SHX5780

## 2016-09-27 SURGERY — COLONOSCOPY WITH PROPOFOL
Anesthesia: Monitor Anesthesia Care

## 2016-09-27 MED ORDER — SODIUM CHLORIDE 0.9 % IV SOLN
INTRAVENOUS | Status: DC
Start: 2016-09-27 — End: 2016-09-27

## 2016-09-27 MED ORDER — LIDOCAINE 2% (20 MG/ML) 5 ML SYRINGE
INTRAMUSCULAR | Status: DC | PRN
  Administered 2016-09-27: 50 mg via INTRAVENOUS

## 2016-09-27 MED ORDER — PROPOFOL 500 MG/50ML IV EMUL
INTRAVENOUS | Status: DC | PRN
  Administered 2016-09-27: 40 ug/kg/min via INTRAVENOUS

## 2016-09-27 MED ORDER — LACTATED RINGERS IV SOLN
INTRAVENOUS | Status: DC
  Administered 2016-09-27 (×2): via INTRAVENOUS

## 2016-09-27 MED ORDER — PROPOFOL 10 MG/ML IV BOLUS
INTRAVENOUS | Status: AC
  Filled 2016-09-27: qty 40

## 2016-09-27 MED ORDER — PROPOFOL 10 MG/ML IV BOLUS
INTRAVENOUS | Status: DC | PRN
  Administered 2016-09-27: 20 mg via INTRAVENOUS
  Administered 2016-09-27: 40 mg via INTRAVENOUS
  Administered 2016-09-27: 20 mg via INTRAVENOUS

## 2016-09-27 SURGICAL SUPPLY — 22 items

## 2016-09-27 NOTE — Anesthesia Preprocedure Evaluation (Addendum)
Anesthesia Evaluation  Patient identified by MRN, date of birth, ID band Patient awake    Reviewed: Allergy & Precautions, NPO status , Patient's Chart, lab work & pertinent test results, reviewed documented beta blocker date and time   Airway Mallampati: I  TM Distance: >3 FB Neck ROM: Full    Dental  (+) Teeth Intact, Dental Advisory Given   Pulmonary neg pulmonary ROS,    breath sounds clear to auscultation       Cardiovascular negative cardio ROS   Rhythm:Regular Rate:Normal     Neuro/Psych negative neurological ROS  negative psych ROS   GI/Hepatic Neg liver ROS, PUD,   Endo/Other  negative endocrine ROS  Renal/GU negative Renal ROS  negative genitourinary   Musculoskeletal negative musculoskeletal ROS (+)   Abdominal   Peds negative pediatric ROS (+)  Hematology negative hematology ROS (+)   Anesthesia Other Findings   Reproductive/Obstetrics negative OB ROS                            Anesthesia Physical Anesthesia Plan  ASA: II  Anesthesia Plan: MAC   Post-op Pain Management:    Induction: Intravenous  Airway Management Planned: Natural Airway  Additional Equipment:   Intra-op Plan:   Post-operative Plan:   Informed Consent: I have reviewed the patients History and Physical, chart, labs and discussed the procedure including the risks, benefits and alternatives for the proposed anesthesia with the patient or authorized representative who has indicated his/her understanding and acceptance.     Plan Discussed with: CRNA  Anesthesia Plan Comments:         Anesthesia Quick Evaluation

## 2016-09-27 NOTE — H&P (Signed)
Procedure: Surveillance colonoscopy. Left sided ulcerative colitis since 1979. 09/20/2011 normal surveillance colonoscopy was performed  History: The patient is a 55 year old female born 1961-03-09. 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 She takes azathioprine chronically.  She is scheduled to undergo a surveillance colonoscopy today  Past medical history: Left-sided ulcerative colitis since 1979. Osteoporosis. ANCA associated vasculitis with glomerular nephritis. Benign right breast biopsy.  Medication allergies: Metronidazole caused rash  Exam: The patient is alert and lying comfortably on the endoscopy stretcher. Abdomen is soft and nontender to palpation. Lungs are clear to auscultation. Cardiac exam reveals a regular rhythm.  Plan: Proceed with surveillance colonoscopy

## 2016-09-27 NOTE — Transfer of Care (Signed)
Immediate Anesthesia Transfer of Care Note  Patient: CHERRYLE BAUMANN  Procedure(s) Performed: Procedure(s): COLONOSCOPY WITH PROPOFOL (N/A)  Patient Location: PACU  Anesthesia Type:MAC  Level of Consciousness: Patient easily awoken, sedated, comfortable, cooperative, following commands, responds to stimulation.   Airway & Oxygen Therapy: Patient spontaneously breathing, ventilating well, oxygen via simple oxygen mask.  Post-op Assessment: Report given to PACU RN, vital signs reviewed and stable, moving all extremities.   Post vital signs: Reviewed and stable.  Complications: No apparent anesthesia complications  Last Vitals:  Vitals:   09/27/16 1022  BP: (!) 151/85  Pulse: (!) 59  Resp: 13  Temp: 36.4 C    Last Pain:  Vitals:   09/27/16 1022  TempSrc: Oral         Complications: No apparent anesthesia complications

## 2016-09-27 NOTE — Op Note (Signed)
[facility] Patient Name: Yesenia Crawford Procedure Date: 09/27/2016 MRN: [id number] Attending MD: Garlan Fair , MD Date of Birth: 07-02-1961 CSN: [id number] Age: 55 Admit Type: Outpatient Procedure:                Colonoscopy Indications:              Left-sided chronic ulcerative colitis since 1979. Providers:                Garlan Fair, MD, Hilma Favors, RN, Elspeth Cho Tech., Technician, Heide Scales, CRNA Referring MD:              Medicines:                Propofol per Anesthesia Complications:            No immediate complications. Estimated Blood Loss:     Estimated blood loss: none. Procedure:                Pre-Anesthesia Assessment:                           - Prior to the procedure, a History and Physical                            was performed, and patient medications and                            allergies were reviewed. The patient's tolerance of                            previous anesthesia was also reviewed. The risks                            and benefits of the procedure and the sedation                            options and risks were discussed with the patient.                            All questions were answered, and informed consent                            was obtained. Prior Anticoagulants: The patient has                            taken no previous anticoagulant or antiplatelet                            agents. ASA Grade Assessment: II - A patient with                            mild systemic disease. After reviewing the risks  and benefits, the patient was deemed in                            satisfactory condition to undergo the procedure.                           After obtaining informed consent, the colonoscope                            was passed under direct vision. Throughout the                            procedure, the patient's blood pressure, pulse, and                           oxygen saturations were monitored continuously. The                            [id number] scope was introduced through                            the anus and advanced to the the cecum, identified                            by appendiceal orifice and ileocecal valve. The                            colonoscopy was technically difficult and complex                            due to significant looping. The patient tolerated                            the procedure well. The quality of the bowel                            preparation was good. The appendiceal orifice and                            the rectum were photographed. Scope In: 12:23:14 PM Scope Out: 12:54:28 PM Scope Withdrawal Time: 0 hours 17 minutes 28 seconds  Total Procedure Duration: 0 hours 31 minutes 14 seconds  Findings:      The perianal and digital rectal examinations were normal.      The entire examined colon appeared normal except for generalized loss in       the rectal vascualar pattern without friability of ulceration.      Four biopsies were taken every 10 cm with a cold forceps from the cecum,       ascending colon, right transverse colon, left transverse colon,       descending colon, sigmoid colon and rectum for ulcerative colitis       surveillance. These biopsy specimens were sent to Pathology. Impression:               - The entire examined colon is normal.                           -  Biopsies for surveillance were taken from the                            cecum, ascending colon, right transverse colon,                            left transverse colon, descending colon, sigmoid                            colon and rectum. Moderate Sedation:      N/A- Per Anesthesia Care Recommendation:           - Patient has a contact number available for                            emergencies. The signs and symptoms of potential                            delayed complications were  discussed with the                            patient. Return to normal activities tomorrow.                            Written discharge instructions were provided to the                            patient.                           - Repeat colonoscopy date to be determined after                            pending pathology results are reviewed for                            surveillance.                           - Resume previous diet.                           - Continue present medications. Procedure Code(s):        --- Professional ---                           [phone], Colonoscopy, flexible; with biopsy, single                            or multiple Diagnosis Code(s):        --- Professional ---                           K51.50, Left sided colitis without complications CPT copyright 2016 [employer]. All rights reserved. The codes documented in this report are preliminary and upon coder review may  be revised to meet current compliance requirements. Earle Gell,  MD Garlan Fair, MD 09/27/2016 1:02:33 PM This report has been signed electronically. Number of Addenda: 0

## 2016-09-27 NOTE — Discharge Instructions (Signed)

## 2016-09-27 NOTE — Anesthesia Postprocedure Evaluation (Signed)
Anesthesia Post Note  Patient: Yesenia Crawford  Procedure(s) Performed: Procedure(s) (LRB): COLONOSCOPY WITH PROPOFOL (N/A)  Patient location during evaluation: PACU Anesthesia Type: MAC Level of consciousness: awake and alert Pain management: pain level controlled Vital Signs Assessment: post-procedure vital signs reviewed and stable Respiratory status: spontaneous breathing, nonlabored ventilation, respiratory function stable and patient connected to nasal cannula oxygen Cardiovascular status: stable and blood pressure returned to baseline Anesthetic complications: no    Last Vitals:  Vitals:   09/27/16 1304 09/27/16 1305  BP: 122/64   Pulse: (!) 54   Resp: 12   Temp:  36.3 C    Last Pain:  Vitals:   09/27/16 1305  TempSrc: Oral                 Effie Berkshire

## 2016-09-28 ENCOUNTER — Encounter (HOSPITAL_COMMUNITY): Payer: Self-pay | Admitting: Gastroenterology

## 2017-02-21 ENCOUNTER — Other Ambulatory Visit: Payer: Self-pay | Admitting: Physician Assistant

## 2017-02-21 DIAGNOSIS — Z1231 Encounter for screening mammogram for malignant neoplasm of breast: Secondary | ICD-10-CM

## 2017-03-09 ENCOUNTER — Ambulatory Visit
Admission: RE | Admit: 2017-03-09 | Discharge: 2017-03-09 | Disposition: A | Discharge status: Home / Self Care | Payer: BC Managed Care – PPO | Source: Ambulatory Visit | Attending: Physician Assistant | Admitting: Physician Assistant

## 2017-03-09 DIAGNOSIS — Z1231 Encounter for screening mammogram for malignant neoplasm of breast: Secondary | ICD-10-CM

## 2017-03-10 ENCOUNTER — Other Ambulatory Visit: Payer: Self-pay | Admitting: Physician Assistant

## 2017-03-10 DIAGNOSIS — R928 Other abnormal and inconclusive findings on diagnostic imaging of breast: Secondary | ICD-10-CM

## 2017-03-14 ENCOUNTER — Ambulatory Visit
Admission: RE | Admit: 2017-03-14 | Discharge: 2017-03-14 | Disposition: A | Discharge status: Home / Self Care | Payer: BC Managed Care – PPO | Source: Ambulatory Visit | Attending: Physician Assistant | Admitting: Physician Assistant

## 2017-03-14 DIAGNOSIS — R928 Other abnormal and inconclusive findings on diagnostic imaging of breast: Secondary | ICD-10-CM

## 2018-08-03 ENCOUNTER — Ambulatory Visit: Payer: BC Managed Care – PPO | Admitting: Podiatry

## 2018-08-03 ENCOUNTER — Ambulatory Visit: Payer: BC Managed Care – PPO

## 2018-08-03 VITALS — BP 128/71 | HR 71

## 2018-08-03 DIAGNOSIS — M722 Plantar fascial fibromatosis: Secondary | ICD-10-CM | POA: Diagnosis not present

## 2018-08-03 DIAGNOSIS — M79672 Pain in left foot: Secondary | ICD-10-CM

## 2018-08-27 NOTE — Progress Notes (Signed)
Subjective:  Patient ID: Yesenia Crawford, female    DOB: September 05, 1961,  MRN: [id number]  Chief Complaint  Patient presents with  . Foot Pain    plantar fibroma left - brought xrays - it has been there maybe 2 months and it's really starting to bug her    57 y.o. female presents with the above complaint.  Complains of mass to the bottom of the left foot arch.  Sitting and starting to bother her.  Review of Systems: Negative except as noted in the HPI. Denies N/V/F/Ch.  Past Medical History:  Diagnosis Date  . 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 Anemia   . Blood transfusion, without reported diagnosis   . IBS (irritable bowel syndrome)   . Pneumonia   . Ulcerative colitis 1981   HAS CURRENT NONBLOODY DIARRHEA X3 WKS  . Ulcerative colitis     Current Outpatient Medications:  .  NON FORMULARY, [facility]  Scar Cream -  Verapamil 10%, Pentoxifylline 5% Apply 1-2 grams to affected area 3-4 times daily Qty. 120 gm 3 refills, Disp: , Rfl:  .  Vitamin D, Ergocalciferol, (DRISDOL) 50000 units CAPS capsule, TAKE 1 CAPSULE BY MOUTH WEEKLY., Disp: , Rfl:  .  azaTHIOprine (IMURAN) 50 MG tablet, Take 50 mg by mouth daily after breakfast., Disp: , Rfl:  .  calcium-vitamin D (OSCAL WITH D) 250-125 MG-UNIT tablet, Take 1 tablet by mouth daily after breakfast., Disp: , Rfl:  .  denosumab (PROLIA) 60 MG/ML SOLN injection, Inject 60 mg into the skin every 6 (six) months. Administer in upper arm, thigh, or abdomen, Disp: , Rfl:  .  folic acid (FOLVITE) 1 MG tablet, Take 1 mg by mouth daily. , Disp: , Rfl:  .  metoprolol succinate (TOPROL-XL) 25 MG 24 hr tablet, Take 25 mg by mouth daily after breakfast., Disp: , Rfl:  .  metoprolol tartrate (LOPRESSOR) 25 MG tablet, Take 25 mg by mouth 2 (two) times daily., Disp: , Rfl:  .  neomycin-polymyxin b-dexamethasone (MAXITROL) 3.5-10000-0.1 OINT, , Disp: , Rfl:  .  sulfaSALAzine  (AZULFIDINE) 500 MG tablet, Take 2,500 mg by mouth Daily. , Disp: , Rfl:  .  venlafaxine XR (EFFEXOR-XR) 37.5 MG 24 hr capsule, Take 37.5 mg by mouth daily., Disp: , Rfl:   Social History   Tobacco Use  Smoking Status Never Smoker  Smokeless Tobacco Never Used    Allergies  Allergen Reactions  . Metronidazole Hives   Objective:   Vitals:   08/03/18 1557  BP: 128/71  Pulse: 71   There is no height or weight on file to calculate BMI. Constitutional Well developed. Well nourished.  Vascular Dorsalis pedis pulses palpable bilaterally. Posterior tibial pulses palpable bilaterally. Capillary refill normal to all digits.  No cyanosis or clubbing noted. Pedal hair growth normal.  Neurologic Normal speech. Oriented to person, place, and time. Epicritic sensation to light touch grossly present bilaterally.  Dermatologic Nails well groomed and normal in appearance. No open wounds. No skin lesions.  Orthopedic: Normal joint ROM without pain or crepitus bilaterally. No visible deformities. No bony tenderness. Palpable mass along the medial arch of the left foot   Radiographs: Prior x-rays reviewed no underlying osseous of normalities Assessment:   1. Plantar fasciitis   2. Plantar fascial fibromatosis    Plan:  Patient was evaluated and treated and all questions answered.  Fibroma left -Discussed possible treatments with patient.  Discussed injection therapy  versus surgical excision versus topical verapamil.  Will trial topical verapamil.  Patient to return should she like to further discuss injection removal.  No follow-ups on file.

## 2018-11-13 ENCOUNTER — Ambulatory Visit
Admission: RE | Admit: 2018-11-13 | Discharge: 2018-11-13 | Disposition: A | Discharge status: Home / Self Care | Payer: BC Managed Care – PPO | Source: Ambulatory Visit | Attending: Physician Assistant | Admitting: Physician Assistant

## 2018-11-13 ENCOUNTER — Other Ambulatory Visit: Payer: Self-pay | Admitting: Physician Assistant

## 2018-11-13 DIAGNOSIS — Z1231 Encounter for screening mammogram for malignant neoplasm of breast: Secondary | ICD-10-CM

## 2020-02-16 ENCOUNTER — Ambulatory Visit: Payer: BC Managed Care – PPO | Attending: Internal Medicine

## 2020-02-16 DIAGNOSIS — Z23 Encounter for immunization: Secondary | ICD-10-CM

## 2020-02-16 NOTE — Progress Notes (Signed)
   Covid-19 Vaccination Clinic  Name:  Yesenia Crawford    MRN: [id number] DOB: [birth year]  02/16/2020  Ms. Yesenia Crawford was observed post Covid-19 immunization for 15 minutes without incidence. She was provided with Vaccine Information Sheet and instruction to access the V-Safe system.   Ms. Yesenia Crawford was instructed to call 911 with any severe reactions post vaccine: Marland Kitchen Difficulty breathing  . Swelling of your face and throat  . A fast heartbeat  . A bad rash all over your body  . Dizziness and weakness    Immunizations Administered    Name Date Dose VIS Date Route   Pfizer COVID-19 Vaccine 02/16/2020  6:27 PM 0.3 mL 11/30/2019 Intramuscular   Manufacturer: [place]   Lot: [id number]   [place]: [id number]

## 2020-03-08 ENCOUNTER — Ambulatory Visit: Payer: BC Managed Care – PPO

## 2020-03-11 ENCOUNTER — Ambulatory Visit: Payer: BC Managed Care – PPO | Attending: Internal Medicine

## 2020-03-11 DIAGNOSIS — Z23 Encounter for immunization: Secondary | ICD-10-CM

## 2020-03-11 NOTE — Progress Notes (Signed)
   Covid-19 Vaccination Clinic  Name:  Yesenia Crawford    MRN: [id number] DOB: 03/01/1961  03/11/2020  [name] was observed post Covid-19 immunization for 15 minutes without incident. She was provided with Vaccine Information Sheet and instruction to access the V-Safe system.   [name] was instructed to call 911 with any severe reactions post vaccine: Marland Kitchen Difficulty breathing  . Swelling of face and throat  . A fast heartbeat  . A bad rash all over body  . Dizziness and weakness   Immunizations Administered    Name Date Dose VIS Date Route   Pfizer COVID-19 Vaccine 03/11/2020  4:24 PM 0.3 mL 11/30/2019 Intramuscular   Manufacturer: [place]   Lot: [id number]   [place]: [id number]

## 2020-06-25 ENCOUNTER — Other Ambulatory Visit: Payer: Self-pay | Admitting: Physician Assistant

## 2020-06-25 DIAGNOSIS — Z1231 Encounter for screening mammogram for malignant neoplasm of breast: Secondary | ICD-10-CM

## 2020-06-26 ENCOUNTER — Other Ambulatory Visit: Payer: Self-pay

## 2020-06-26 ENCOUNTER — Ambulatory Visit
Admission: RE | Admit: 2020-06-26 | Discharge: 2020-06-26 | Disposition: A | Discharge status: Home / Self Care | Payer: BC Managed Care – PPO | Source: Ambulatory Visit | Attending: Physician Assistant | Admitting: Physician Assistant

## 2020-06-26 DIAGNOSIS — Z1231 Encounter for screening mammogram for malignant neoplasm of breast: Secondary | ICD-10-CM

## 2022-03-09 ENCOUNTER — Other Ambulatory Visit: Payer: Self-pay | Admitting: Physician Assistant

## 2022-03-09 DIAGNOSIS — Z1231 Encounter for screening mammogram for malignant neoplasm of breast: Secondary | ICD-10-CM

## 2022-03-11 ENCOUNTER — Ambulatory Visit: Payer: BC Managed Care – PPO

## 2022-03-16 ENCOUNTER — Ambulatory Visit
Admission: RE | Admit: 2022-03-16 | Discharge: 2022-03-16 | Disposition: A | Discharge status: Home / Self Care | Payer: BC Managed Care – PPO | Source: Ambulatory Visit | Attending: Physician Assistant | Admitting: Physician Assistant

## 2022-03-16 DIAGNOSIS — Z1231 Encounter for screening mammogram for malignant neoplasm of breast: Secondary | ICD-10-CM

## 2024-01-02 ENCOUNTER — Other Ambulatory Visit: Payer: Self-pay | Admitting: Physician Assistant

## 2024-01-02 DIAGNOSIS — Z1231 Encounter for screening mammogram for malignant neoplasm of breast: Secondary | ICD-10-CM

## 2024-01-05 ENCOUNTER — Ambulatory Visit
Admission: RE | Admit: 2024-01-05 | Discharge: 2024-01-05 | Disposition: A | Discharge status: Home / Self Care | Payer: 59 | Source: Ambulatory Visit | Attending: Physician Assistant | Admitting: Physician Assistant

## 2024-01-05 DIAGNOSIS — Z1231 Encounter for screening mammogram for malignant neoplasm of breast: Secondary | ICD-10-CM
